# Patient Record
Sex: Female | Born: 1980 | Race: White | Hispanic: No | Marital: Married | Smoking: Former smoker
Health system: Southern US, Community
[De-identification: ages and names within clinical notes are randomized; demographics above are authoritative.]

## PROBLEM LIST (undated history)

## (undated) DIAGNOSIS — R112 Nausea with vomiting, unspecified: Secondary | ICD-10-CM

## (undated) DIAGNOSIS — F909 Attention-deficit hyperactivity disorder, unspecified type: Secondary | ICD-10-CM

## (undated) DIAGNOSIS — R51 Headache: Secondary | ICD-10-CM

## (undated) DIAGNOSIS — I7300 Raynaud's syndrome without gangrene: Secondary | ICD-10-CM

## (undated) DIAGNOSIS — M2550 Pain in unspecified joint: Secondary | ICD-10-CM

## (undated) DIAGNOSIS — G4700 Insomnia, unspecified: Secondary | ICD-10-CM

## (undated) DIAGNOSIS — N7011 Chronic salpingitis: Secondary | ICD-10-CM

## (undated) DIAGNOSIS — Z973 Presence of spectacles and contact lenses: Secondary | ICD-10-CM

## (undated) DIAGNOSIS — F419 Anxiety disorder, unspecified: Secondary | ICD-10-CM

## (undated) DIAGNOSIS — Z9079 Acquired absence of other genital organ(s): Secondary | ICD-10-CM

## (undated) DIAGNOSIS — K589 Irritable bowel syndrome without diarrhea: Secondary | ICD-10-CM

## (undated) DIAGNOSIS — T4145XA Adverse effect of unspecified anesthetic, initial encounter: Secondary | ICD-10-CM

## (undated) DIAGNOSIS — Z9889 Other specified postprocedural states: Secondary | ICD-10-CM

## (undated) DIAGNOSIS — T783XXA Angioneurotic edema, initial encounter: Secondary | ICD-10-CM

## (undated) HISTORY — DX: Raynaud's syndrome without gangrene: I73.00

## (undated) HISTORY — DX: Angioneurotic edema, initial encounter: T78.3XXA

## (undated) HISTORY — DX: Presence of spectacles and contact lenses: Z97.3

## (undated) HISTORY — DX: Pain in unspecified joint: M25.50

## (undated) HISTORY — DX: Acquired absence of other genital organ(s): Z90.79

## (undated) HISTORY — DX: Insomnia, unspecified: G47.00

## (undated) HISTORY — DX: Chronic salpingitis: N70.11

## (undated) HISTORY — PX: DIAGNOSTIC LAPAROSCOPY: SUR761

---
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
## 2002-03-07 ENCOUNTER — Ambulatory Visit (HOSPITAL_COMMUNITY): Admission: RE | Admit: 2002-03-07 | Discharge: 2002-03-07 | Payer: Self-pay | Admitting: Gastroenterology

## 2002-03-07 ENCOUNTER — Encounter: Payer: Self-pay | Admitting: Gastroenterology

## 2002-03-16 ENCOUNTER — Encounter: Payer: Self-pay | Admitting: Gastroenterology

## 2002-03-16 ENCOUNTER — Ambulatory Visit (HOSPITAL_COMMUNITY): Admission: RE | Admit: 2002-03-16 | Discharge: 2002-03-16 | Payer: Self-pay | Admitting: Gastroenterology

## 2004-02-02 ENCOUNTER — Other Ambulatory Visit: Admission: RE | Admit: 2004-02-02 | Discharge: 2004-02-02 | Payer: Self-pay | Admitting: Obstetrics & Gynecology

## 2004-04-21 HISTORY — PX: ESOPHAGOGASTRODUODENOSCOPY: SHX1529

## 2004-11-13 ENCOUNTER — Ambulatory Visit: Payer: Self-pay | Admitting: Gastroenterology

## 2005-04-17 ENCOUNTER — Other Ambulatory Visit: Admission: RE | Admit: 2005-04-17 | Discharge: 2005-04-17 | Payer: Self-pay | Admitting: Obstetrics & Gynecology

## 2005-04-18 ENCOUNTER — Other Ambulatory Visit: Admission: RE | Admit: 2005-04-18 | Discharge: 2005-04-18 | Payer: Self-pay | Admitting: Obstetrics & Gynecology

## 2009-06-08 ENCOUNTER — Ambulatory Visit: Payer: Self-pay | Admitting: Family Medicine

## 2010-04-21 DIAGNOSIS — N7011 Chronic salpingitis: Secondary | ICD-10-CM

## 2010-04-21 HISTORY — PX: BILATERAL SALPINGECTOMY: SHX5743

## 2010-04-21 HISTORY — DX: Chronic salpingitis: N70.11

## 2011-02-19 NOTE — Patient Instructions (Addendum)
   Your procedure is scheduled on: Tuesday November 13th  Enter through the [employer] of [facility] at: [employer] up the phone at the desk and dial [phone] and inform us of your arrival.  Please call this number if you have any problems the morning of surgery: [phone]  Remember: Do not eat food after midnight: Monday Do not drink clear liquids after:midnight Monday Take these medicines the morning of surgery with a SIP OF WATER:none  Do not wear jewelry, make-up, or FINGER nail polish Do not wear lotions, powders, or perfumes.  You may not  wear deodorant. Do not shave 48 hours prior to surgery. Do not bring valuables to the hospital.  Leave suitcase in the car. After Surgery it may be brought to your room. For patients being admitted to the hospital, checkout time is 11:00am the day of discharge.  Patients discharged on the day of surgery will not be allowed to drive home.     Remember to use your hibiclens as instructed.Please shower with 1/2 bottle the evening before your surgery and the other 1/2 bottle the morning of surgery.

## 2011-02-21 ENCOUNTER — Encounter (HOSPITAL_COMMUNITY)
Admission: RE | Admit: 2011-02-21 | Discharge: 2011-02-21 | Disposition: A | Discharge status: Home / Self Care | Payer: BC Managed Care – PPO | Source: Ambulatory Visit | Attending: Obstetrics & Gynecology | Admitting: Obstetrics & Gynecology

## 2011-02-21 ENCOUNTER — Encounter (HOSPITAL_COMMUNITY): Payer: Self-pay

## 2011-02-21 HISTORY — DX: Adverse effect of unspecified anesthetic, initial encounter: T41.45XA

## 2011-02-21 HISTORY — DX: Irritable bowel syndrome, unspecified: K58.9

## 2011-02-21 HISTORY — DX: Other specified postprocedural states: Z98.890

## 2011-02-21 HISTORY — DX: Nausea with vomiting, unspecified: R11.2

## 2011-02-21 HISTORY — DX: Headache: R51

## 2011-02-21 HISTORY — DX: Anxiety disorder, unspecified: F41.9

## 2011-02-21 HISTORY — DX: Attention-deficit hyperactivity disorder, unspecified type: F90.9

## 2011-02-21 LAB — CBC
HCT: 41.6 % (ref 36.0–46.0)
Hemoglobin: 14.2 g/dL (ref 12.0–15.0)
MCH: 30 pg (ref 26.0–34.0)
MCHC: 34.1 g/dL (ref 30.0–36.0)
MCV: 87.8 fL (ref 78.0–100.0)
Platelets: 280 10*3/uL (ref 150–400)
RBC: 4.74 MIL/uL (ref 3.87–5.11)
RDW: 12.3 % (ref 11.5–15.5)
WBC: 8.6 10*3/uL (ref 4.0–10.5)

## 2011-02-21 LAB — SURGICAL PCR SCREEN: MRSA, PCR: NEGATIVE

## 2011-02-25 ENCOUNTER — Other Ambulatory Visit: Payer: Self-pay | Admitting: Obstetrics & Gynecology

## 2011-03-04 ENCOUNTER — Ambulatory Visit (HOSPITAL_COMMUNITY): Payer: BC Managed Care – PPO | Admitting: Anesthesiology

## 2011-03-04 ENCOUNTER — Other Ambulatory Visit: Payer: Self-pay | Admitting: Obstetrics & Gynecology

## 2011-03-04 ENCOUNTER — Ambulatory Visit (HOSPITAL_COMMUNITY)
Admission: RE | Admit: 2011-03-04 | Discharge: 2011-03-04 | Disposition: A | Discharge status: Home / Self Care | Payer: BC Managed Care – PPO | Source: Ambulatory Visit | Attending: Obstetrics & Gynecology | Admitting: Obstetrics & Gynecology

## 2011-03-04 ENCOUNTER — Encounter (HOSPITAL_COMMUNITY): Payer: Self-pay | Admitting: Anesthesiology

## 2011-03-04 ENCOUNTER — Encounter (HOSPITAL_COMMUNITY): Payer: Self-pay | Admitting: *Deleted

## 2011-03-04 ENCOUNTER — Encounter (HOSPITAL_COMMUNITY)
Admission: RE | Disposition: A | Discharge status: Home / Self Care | Payer: Self-pay | Source: Ambulatory Visit | Attending: Obstetrics & Gynecology

## 2011-03-04 DIAGNOSIS — N7013 Chronic salpingitis and oophoritis: Secondary | ICD-10-CM | POA: Insufficient documentation

## 2011-03-04 DIAGNOSIS — Z01812 Encounter for preprocedural laboratory examination: Secondary | ICD-10-CM | POA: Insufficient documentation

## 2011-03-04 DIAGNOSIS — N979 Female infertility, unspecified: Secondary | ICD-10-CM | POA: Insufficient documentation

## 2011-03-04 DIAGNOSIS — N83209 Unspecified ovarian cyst, unspecified side: Secondary | ICD-10-CM | POA: Insufficient documentation

## 2011-03-04 DIAGNOSIS — Z01818 Encounter for other preprocedural examination: Secondary | ICD-10-CM | POA: Insufficient documentation

## 2011-03-04 DIAGNOSIS — N949 Unspecified condition associated with female genital organs and menstrual cycle: Secondary | ICD-10-CM | POA: Insufficient documentation

## 2011-03-04 DIAGNOSIS — N736 Female pelvic peritoneal adhesions (postinfective): Secondary | ICD-10-CM | POA: Insufficient documentation

## 2011-03-04 LAB — CBC
MCH: 29.6 pg (ref 26.0–34.0)
Platelets: 280 10*3/uL (ref 150–400)
RBC: 4.73 MIL/uL (ref 3.87–5.11)
RDW: 12.1 % (ref 11.5–15.5)
WBC: 7.8 10*3/uL (ref 4.0–10.5)

## 2011-03-04 LAB — TYPE AND SCREEN

## 2011-03-04 LAB — PREGNANCY, URINE: Preg Test, Ur: NEGATIVE

## 2011-03-04 SURGERY — ROBOTIC ASSISTED LAPAROSCOPIC LYSIS OF ADHESION
Anesthesia: General | Site: Abdomen | Wound class: Clean Contaminated

## 2011-03-04 MED ORDER — SCOPOLAMINE 1 MG/3DAYS TD PT72
MEDICATED_PATCH | TRANSDERMAL | Status: AC
  Filled 2011-03-04: qty 1

## 2011-03-04 MED ORDER — ONDANSETRON HCL 4 MG/2ML IJ SOLN
INTRAMUSCULAR | Status: AC
  Filled 2011-03-04: qty 2

## 2011-03-04 MED ORDER — SCOPOLAMINE 1 MG/3DAYS TD PT72
1.0000 | MEDICATED_PATCH | Freq: Once | TRANSDERMAL | Status: DC
  Administered 2011-03-04: 1.5 mg via TRANSDERMAL

## 2011-03-04 MED ORDER — PROPOFOL 10 MG/ML IV EMUL
INTRAVENOUS | Status: AC
  Filled 2011-03-04: qty 20

## 2011-03-04 MED ORDER — GLYCOPYRROLATE 0.2 MG/ML IJ SOLN
INTRAMUSCULAR | Status: AC
  Filled 2011-03-04: qty 1

## 2011-03-04 MED ORDER — MIDAZOLAM HCL 5 MG/5ML IJ SOLN
INTRAMUSCULAR | Status: DC | PRN
  Administered 2011-03-04: 2 mg via INTRAVENOUS

## 2011-03-04 MED ORDER — DEXAMETHASONE SODIUM PHOSPHATE 10 MG/ML IJ SOLN
INTRAMUSCULAR | Status: AC
  Filled 2011-03-04: qty 1

## 2011-03-04 MED ORDER — ONDANSETRON HCL 4 MG/2ML IJ SOLN
INTRAMUSCULAR | Status: DC | PRN
  Administered 2011-03-04: 4 mg via INTRAVENOUS

## 2011-03-04 MED ORDER — FENTANYL CITRATE 0.05 MG/ML IJ SOLN
INTRAMUSCULAR | Status: AC
  Administered 2011-03-04: 25 ug via INTRAVENOUS
  Filled 2011-03-04: qty 2

## 2011-03-04 MED ORDER — ROCURONIUM BROMIDE 50 MG/5ML IV SOLN
INTRAVENOUS | Status: AC
  Filled 2011-03-04: qty 1

## 2011-03-04 MED ORDER — LIDOCAINE HCL (CARDIAC) 20 MG/ML IV SOLN
INTRAVENOUS | Status: DC | PRN
  Administered 2011-03-04: 50 mg via INTRAVENOUS

## 2011-03-04 MED ORDER — ROCURONIUM BROMIDE 100 MG/10ML IV SOLN
INTRAVENOUS | Status: DC | PRN
  Administered 2011-03-04: 10 mg via INTRAVENOUS
  Administered 2011-03-04: 35 mg via INTRAVENOUS
  Administered 2011-03-04: 5 mg via INTRAVENOUS
  Administered 2011-03-04: 10 mg via INTRAVENOUS

## 2011-03-04 MED ORDER — FENTANYL CITRATE 0.05 MG/ML IJ SOLN
INTRAMUSCULAR | Status: AC
  Filled 2011-03-04: qty 5

## 2011-03-04 MED ORDER — KETOROLAC TROMETHAMINE 30 MG/ML IJ SOLN
INTRAMUSCULAR | Status: DC | PRN
  Administered 2011-03-04: 30 mg via INTRAVENOUS

## 2011-03-04 MED ORDER — FENTANYL CITRATE 0.05 MG/ML IJ SOLN
INTRAMUSCULAR | Status: DC | PRN
  Administered 2011-03-04 (×2): 100 ug via INTRAVENOUS
  Administered 2011-03-04: 150 ug via INTRAVENOUS

## 2011-03-04 MED ORDER — OXYCODONE-ACETAMINOPHEN 7.5-325 MG PO TABS: 1.0000 | ORAL_TABLET | ORAL | Status: AC | PRN

## 2011-03-04 MED ORDER — NEOSTIGMINE METHYLSULFATE 1 MG/ML IJ SOLN
INTRAMUSCULAR | Status: DC | PRN
  Administered 2011-03-04: 3 mg via INTRAVENOUS

## 2011-03-04 MED ORDER — KETOROLAC TROMETHAMINE 30 MG/ML IJ SOLN
INTRAMUSCULAR | Status: AC
  Filled 2011-03-04: qty 1

## 2011-03-04 MED ORDER — RINGERS IRRIGATION IR SOLN
Status: DC | PRN
  Administered 2011-03-04: 1

## 2011-03-04 MED ORDER — CEFAZOLIN SODIUM 1-5 GM-% IV SOLN
INTRAVENOUS | Status: AC
  Filled 2011-03-04: qty 50

## 2011-03-04 MED ORDER — CEFAZOLIN SODIUM 1-5 GM-% IV SOLN: 1.0000 g | INTRAVENOUS | Status: DC

## 2011-03-04 MED ORDER — NEOSTIGMINE METHYLSULFATE 1 MG/ML IJ SOLN
INTRAMUSCULAR | Status: AC
  Filled 2011-03-04: qty 10

## 2011-03-04 MED ORDER — GLYCOPYRROLATE 0.2 MG/ML IJ SOLN
INTRAMUSCULAR | Status: DC | PRN
  Administered 2011-03-04: .6 mg via INTRAVENOUS

## 2011-03-04 MED ORDER — BUPIVACAINE HCL (PF) 0.25 % IJ SOLN
INTRAMUSCULAR | Status: DC | PRN
  Administered 2011-03-04: 18 mL

## 2011-03-04 MED ORDER — PROPOFOL 10 MG/ML IV EMUL
INTRAVENOUS | Status: DC | PRN
  Administered 2011-03-04: 150 mg via INTRAVENOUS

## 2011-03-04 MED ORDER — LIDOCAINE HCL (CARDIAC) 20 MG/ML IV SOLN
INTRAVENOUS | Status: AC
  Filled 2011-03-04: qty 5

## 2011-03-04 MED ORDER — MEPERIDINE HCL 25 MG/ML IJ SOLN: 6.2500 mg | INTRAMUSCULAR | Status: DC | PRN

## 2011-03-04 MED ORDER — FENTANYL CITRATE 0.05 MG/ML IJ SOLN
25.0000 ug | INTRAMUSCULAR | Status: DC | PRN
  Administered 2011-03-04: 25 ug via INTRAVENOUS

## 2011-03-04 MED ORDER — MIDAZOLAM HCL 2 MG/2ML IJ SOLN
INTRAMUSCULAR | Status: AC
  Filled 2011-03-04: qty 2

## 2011-03-04 MED ORDER — FENTANYL CITRATE 0.05 MG/ML IJ SOLN
INTRAMUSCULAR | Status: AC
  Filled 2011-03-04: qty 2

## 2011-03-04 MED ORDER — LACTATED RINGERS IV SOLN
INTRAVENOUS | Status: DC
  Administered 2011-03-04: 1000 mL via INTRAVENOUS
  Administered 2011-03-04: 10:00:00 via INTRAVENOUS

## 2011-03-04 MED ORDER — KETOROLAC TROMETHAMINE 30 MG/ML IJ SOLN: 15.0000 mg | Freq: Once | INTRAMUSCULAR | Status: DC | PRN

## 2011-03-04 MED ORDER — CEFAZOLIN SODIUM 1-5 GM-% IV SOLN
INTRAVENOUS | Status: DC | PRN
  Administered 2011-03-04: 1 g via INTRAVENOUS

## 2011-03-04 MED ORDER — PROMETHAZINE HCL 25 MG/ML IJ SOLN: 6.2500 mg | INTRAMUSCULAR | Status: DC | PRN

## 2011-03-04 MED ORDER — DEXAMETHASONE SODIUM PHOSPHATE 4 MG/ML IJ SOLN
INTRAMUSCULAR | Status: DC | PRN
  Administered 2011-03-04: 10 mg via INTRAVENOUS

## 2011-03-04 SURGICAL SUPPLY — 37 items
ADH SKN CLS APL DERMABOND .7 (GAUZE/BANDAGES/DRESSINGS) ×2
CABLE HIGH FREQUENCY MONO STRZ (ELECTRODE) ×3 IMPLANT
CLOTH BEACON ORANGE TIMEOUT ST (SAFETY) ×3 IMPLANT
CONT PATH 16OZ SNAP LID 3702 (MISCELLANEOUS) ×3 IMPLANT
COVER MAYO STAND STRL (DRAPES) ×3 IMPLANT
COVER TABLE BACK 60X90 (DRAPES) ×6 IMPLANT
COVER TIP SHEARS 8 DVNC (MISCELLANEOUS) ×2 IMPLANT
COVER TIP SHEARS 8MM DA VINCI (MISCELLANEOUS) ×1
DERMABOND ADVANCED (GAUZE/BANDAGES/DRESSINGS) ×1
DERMABOND ADVANCED .7 DNX12 (GAUZE/BANDAGES/DRESSINGS) ×4 IMPLANT
DRAPE HUG U DISPOSABLE (DRAPE) ×3 IMPLANT
DRAPE LG THREE QUARTER DISP (DRAPES) ×6 IMPLANT
DRAPE MONITOR DA VINCI (DRAPE) ×3 IMPLANT
DRAPE WARM FLUID 44X44 (DRAPE) ×3 IMPLANT
ELECT REM PT RETURN 9FT ADLT (ELECTROSURGICAL) ×3
ELECTRODE REM PT RTRN 9FT ADLT (ELECTROSURGICAL) ×2 IMPLANT
EVACUATOR SMOKE 8.L (FILTER) ×3 IMPLANT
GLOVE BIO SURGEON STRL SZ 6.5 (GLOVE) ×6 IMPLANT
GOWN PREVENTION PLUS LG XLONG (DISPOSABLE) ×12 IMPLANT
KIT DISP ACCESSORY 4 ARM (KITS) ×3 IMPLANT
PACK LAVH (CUSTOM PROCEDURE TRAY) ×3 IMPLANT
PAD PREP 24X48 CUFFED NSTRL (MISCELLANEOUS) ×6 IMPLANT
POSITIONER SURGICAL ARM (MISCELLANEOUS) ×6 IMPLANT
SET IRRIG TUBING LAPAROSCOPIC (IRRIGATION / IRRIGATOR) ×5 IMPLANT
SOLUTION ELECTROLUBE (MISCELLANEOUS) ×3 IMPLANT
SUT VIC AB 4-0 PS2 27 (SUTURE) ×6 IMPLANT
SYR 50ML LL SCALE MARK (SYRINGE) ×3 IMPLANT
SYSTEM CONVERTIBLE TROCAR (TROCAR) IMPLANT
TOWEL OR 17X24 6PK STRL BLUE (TOWEL DISPOSABLE) ×9 IMPLANT
TRAY FOLEY BAG SILVER LF 14FR (CATHETERS) ×3 IMPLANT
TROCAR 12M 150ML BLUNT (TROCAR) IMPLANT
TROCAR DISP BLADELESS 8 DVNC (TROCAR) ×2 IMPLANT
TROCAR DISP BLADELESS 8MM (TROCAR) ×1
TROCAR XCEL NON-BLD 5MMX100MML (ENDOMECHANICALS) ×3 IMPLANT
TUBING FILTER THERMOFLATOR (ELECTROSURGICAL) ×6 IMPLANT
WARMER LAPAROSCOPE (MISCELLANEOUS) ×3 IMPLANT
WATER STERILE IRR 1000ML POUR (IV SOLUTION) ×9 IMPLANT

## 2011-03-04 NOTE — H&P (Signed)
Cheyenne [name] is an 30 y.o. female G0  RP:  Bilateral hydrosalpinges with pelvic pain, adhesions and infertility.  Pertinent Gynecological History: Menses: flow is moderate Contraception: none Blood transfusions: none Sexually transmitted diseases: no past history Previous GYN Procedures: LPS previous tuboplasty  Last mammogram: none Last pap: normal OB History: G0 infertility   Menstrual History:  No LMP recorded.    Past Medical History  Diagnosis Date  . Headache   . Anxiety   . ADD (attention deficit disorder with hyperactivity)   . ADHD (attention deficit hyperactivity disorder)   . IBS (irritable bowel syndrome)     lactose intol  . Complication of anesthesia 93    93hallucinations with anesthesia, 2001-slow to awaken  . PONV (postoperative nausea and vomiting)     Past Surgical History  Procedure Date  . Appendectomy 93  . Incise and drain abcess 93    hallucinations with anesthesia  . Diagnostic laparoscopy 2001  . Cholecystectomy 2003    ponv    History reviewed. No pertinent family history.  Social History:  reports that she quit smoking about 3 years ago. Her smoking use included Cigarettes. She quit after 10 years of use. She does not have any smokeless tobacco history on file. She reports that she drinks alcohol. She reports that she does not use illicit drugs.  Allergies: No Known Allergies  Prescriptions prior to admission  Medication Sig Dispense Refill  . amphetamine-dextroamphetamine (ADDERALL XR) 20 MG 24 hr capsule Take 20 mg by mouth 2 (two) times daily.        . benzonatate (TESSALON) 100 MG capsule Take 100 mg by mouth 3 (three) times daily as needed. For cough       . DiphenhydrAMINE HCl, Sleep, (ZZZQUIL) 25 MG CAPS Take 1 capsule by mouth at bedtime as needed. for sleep       . Levonorg-Eth Estrad Triphasic (ENPRESSE-28 PO) Take 1 tablet by mouth daily.        . prenatal vitamin w/FE, FA (PRENATAL 1 + 1) 27-1 MG TABS Take 1 tablet by  mouth daily.        . Pseudoeph-CPM-DM-APAP (COUGH COLD/FLU RELIEF M) 15-1-7.5-162.5 MG/5ML LIQD Take 5 mLs by mouth daily.          Blood pressure 103/69, pulse 88, temperature 98.1 F (36.7 C), temperature source Oral, resp. rate 16, SpO2 100.00%.  Results for orders placed during the hospital encounter of 03/04/11 (from the past 24 hour(s))  PREGNANCY, URINE     Status: Normal   Collection Time   03/04/11  6:11 AM      Component Value Range   Preg Test, Ur NEGATIVE    CBC     Status: Normal   Collection Time   03/04/11  6:35 AM      Component Value Range   WBC 7.8  4.0 - 10.5 (K/uL)   RBC 4.73  3.87 - 5.11 (MIL/uL)   Hemoglobin 14.0  12.0 - 15.0 (g/dL)   HCT 66.4  40.3 - 47.4 (%)   MCV 88.2  78.0 - 100.0 (fL)   MCH 29.6  26.0 - 34.0 (pg)   MCHC 33.6  30.0 - 36.0 (g/dL)   RDW 25.9  56.3 - 87.5 (%)   Platelets 280  150 - 400 (K/uL)    No results found.  Assessment/Plan: Bilataral hydrosalpinges with pelvic pain, adhesions and infertility for bilateral salpingectomies, lysis of adhesions assisted by [employer].  Surgery and risks reviewed.  Antrice Pal,MARIE-LYNE 03/04/2011, 7:22 AM

## 2011-03-04 NOTE — Anesthesia Preprocedure Evaluation (Signed)
Anesthesia Evaluation  Patient identified by MRN, date of birth, ID band Patient awake    Reviewed: Allergy & Precautions, H&P , NPO status , Patient's Chart, lab work & pertinent test results  Airway Mallampati: I TM Distance: >3 FB Neck ROM: full    Dental  (+) Teeth Intact   Pulmonary neg pulmonary ROS,    Pulmonary exam normal       Cardiovascular neg cardio ROS     Neuro/Psych    GI/Hepatic negative GI ROS, Neg liver ROS,   Endo/Other  Negative Endocrine ROS  Renal/GU negative Renal ROS  Genitourinary negative   Musculoskeletal negative musculoskeletal ROS (+)   Abdominal Normal abdominal exam  (+)   Peds negative pediatric ROS (+)  Hematology negative hematology ROS (+)   Anesthesia Other Findings   Reproductive/Obstetrics negative OB ROS                           Anesthesia Physical Anesthesia Plan  ASA: II  Anesthesia Plan: General   Post-op Pain Management:    Induction: Intravenous  Airway Management Planned: Oral ETT  Additional Equipment:   Intra-op Plan:   Post-operative Plan: Extubation in OR  Informed Consent: I have reviewed the patients History and Physical, chart, labs and discussed the procedure including the risks, benefits and alternatives for the proposed anesthesia with the patient or authorized representative who has indicated his/her understanding and acceptance.   Dental Advisory Given  Plan Discussed with: CRNA  Anesthesia Plan Comments:         Anesthesia Quick Evaluation

## 2011-03-04 NOTE — Anesthesia Procedure Notes (Signed)
Procedure Name: Intubation Date/Time: 03/04/2011 7:30 AM Performed by: Isabella Bowens Pre-anesthesia Checklist: Patient identified, Emergency Drugs available, Timeout performed, Suction available and Patient being monitored Patient Re-evaluated:Patient Re-evaluated prior to inductionOxygen Delivery Method: Circle System Utilized Preoxygenation: Pre-oxygenation with 100% oxygen Intubation Type: IV induction Ventilation: Mask ventilation without difficulty Laryngoscope Size: Mac and 3 Tube type: Oral Tube size: 7.0 mm Number of attempts: 1 Airway Equipment and Method: stylet Placement Confirmation: ETT inserted through vocal cords under direct vision,  breath sounds checked- equal and bilateral and positive ETCO2 Secured at: 21 cm Tube secured with: Tape Dental Injury: Teeth and Oropharynx as per pre-operative assessment

## 2011-03-04 NOTE — Transfer of Care (Signed)
Immediate Anesthesia Transfer of Care Note  Patient: Cheyenne [name]  Procedure(s) Performed:  ROBOTIC ASSISTED LAPAROSCOPIC LYSIS OF ADHESION - Extensive Lysis of Adhesions with Left Ovarian Cystectomy; BILATERAL SALPINGECTOMY - Bilateral Salpingectomies  Patient Location: PACU  Anesthesia Type: General  Level of Consciousness: oriented and sedated  Airway & Oxygen Therapy: Patient Spontanous Breathing and Patient connected to nasal cannula oxygen  Post-op Assessment: Report given to PACU RN and Post -op Vital signs reviewed and stable  Post vital signs: Reviewed and stable  Complications: No apparent anesthesia complications

## 2011-03-04 NOTE — Progress Notes (Signed)
Pt with 2 attempts to void in bathroom, unsuccessful, instructed pt to drink plenty of fluids and call in unable to void in 5 hours.

## 2011-03-04 NOTE — Op Note (Signed)
03/04/2011  10:24 AM  PATIENT:  Cheyenne [name]  30 y.o. female  PRE-OPERATIVE DIAGNOSIS:  Bilateral Hydrosalpinges; Pelvic Adhesions; Pelvic Pain; Infertility  POST-OPERATIVE DIAGNOSIS:  Bilateral Hydrosalpinges; Left simple ovarian cyst; Pelvic Adhesions; Pelvic Pain; Infertility  PROCEDURE:  Procedure(s): ROBOTIC ASSISTED LAPAROSCOPIC EXTENSIVE LYSIS OF ADHESION BILATERAL SALPINGECTOMY AND LEFT OVARIAN CYSTECTOMY  SURGEON:  Surgeon(s): Marie-Lyne Ianmichael Amescua Sheronette Cathie Beams, MD  ASSISTANTS: Serita Kyle, MD  ANESTHESIA:   general  PROCEDURE:  Under general anesthesia the patient is in lithotomy position. She is prepped with ChloraPrep on the abdomen and with Betadine on the vulvar and vaginal areas. The patient is draped as usual. The Foley is put in place in the bladder. The vaginal exam reveals an anteverted uterus mobile, soft adnexal masses are present.  The weighted speculum is put in the vagina, the anterior lip of the cervix is grasped with a tenaculum and the acorn uterine cannula is put in place. At the level of the abdomen, the supraumbilical area is infiltrated with Marcaine one quarter plain. A 1.5 cm incision is done with the scalpel. The aponeurosis is opened with Mayo scissors under direct vision. The parietal peritoneum is opened bluntly with the finger. A pursestring stitch of Vicryl 0 is done on the aponeurosis. The Roseanne Reno is inserted at that level under direct vision. A pneumoperitoneum is created with CO2.  The camera is am inserted at that level. No adhesion was present with the anterior abdominal wall were trochars will be inserted. A semicircular configuration for trocar placement is used. 2 robotic ports are inserted under direct vision on the right side and one robotic port on the lower left. The the 5 mm assistant port is inserted on the left superior side.the robot his docked from the right side. The instruments are inserted, the prograsp in the third arm,  the Endo Shears scissor and the right arm, and the PK in the left arm.  We then go to the console. The surgery is recorded on a DVD and pictures are taken before and after the procedure.  On inspection of the pelvis we note large bilateral hydrosalpinges. The left ovary has a calm small simple cyst, about 2-3 cm in diameter. Adhesions are present at the level of the sigmoid with the the left pelvic and abdominal walls.  Adhesions also present on between on the left ovary and the ovarian fossa.  Adhesions are present on the right between the tube and the ovary with of the ovarian fossa and pelvic wall. The small bowel is adherent on the right with the pelvic wall.  Finally adhesions are present between the bladder and the uterus anteriorly. All the adhesions described were lysed. We used the PK and the Endo Shears scissor to complete the right first and then left salpingectomies. We started at the proximal aspect of the tubes on each side and followed the lower aspect of the tubes until both tubes were completely freed.  Traction and countertraction was used with the PK and the Prograsp to complete the left ovarian cystectomy.  The cystic fluid was clear and the wall was very thin. The ovarian cyst wall specimen was sent to pathology.  The PK was used to complete hemostasis at the ovarian cyst bed.  At the end of the procedure the anatomy was completely restored.  Hemostasis was adequate at all levels.  The ureters were in normal anatomic position. The patient is post appendectomy. The liver was normal to inspection.  The robotic instruments  were removed under direct vision. We then undocked the robot. We proceeded by laparoscopy with a 5 mm camera. An Endobag was used to remove the the specimens, the right and left tubes. We then irrigated and suctioned the abdominopelvic cavities. Interceed was on applied in the pelvis at the sites of lysis of adhesions. Hemostasis was adequate at all levels. All instruments were  removed the CO2 was on evacuated. The pursestring stitch was attached at the supraumbilical incision. Hemostasis was completed with the Bovie as needed. All incisions were closed with a Vicryl 4-0 subcuticular stitch. Dermabond was added on all incisions. The acorn and tenaculum were removed from the vagina.  ESTIMATED BLOOD LOSS: 70 cc   Intake/Output Summary (Last 24 hours) at 03/04/11 1024 Last data filed at 03/04/11 1000  Gross per 24 hour  Intake   1000 ml  Output    170 ml  Net    830 ml     BLOOD ADMINISTERED:none   LOCAL MEDICATIONS USED:  MARCAINE 20 CC  SPECIMEN:  Source of Specimen:  Bilateral tubes and left ovarian cyst wall  DISPOSITION OF SPECIMEN:  PATHOLOGY  COUNTS:  YES  PLAN OF CARE: Transfer to PACU   Genia Del MD

## 2011-03-04 NOTE — Anesthesia Postprocedure Evaluation (Signed)
  Anesthesia Post-op Note  Patient: Cheyenne [name]  Procedure(s) Performed:  ROBOTIC ASSISTED LAPAROSCOPIC LYSIS OF ADHESION - Extensive Lysis of Adhesions with Left Ovarian Cystectomy; BILATERAL SALPINGECTOMY - Bilateral Salpingectomies  Patient is awake and responsive. Pain and nausea are reasonably well controlled. Vital signs are stable and clinically acceptable. Oxygen saturation is clinically acceptable. There are no apparent anesthetic complications at this time. Patient is ready for discharge.

## 2011-03-04 NOTE — Discharge Summary (Signed)
  Physician Discharge Summary  Patient ID: Cheyenne [name] MRN: [id number] DOB/AGE: 1980/09/15 30 y.o.  Admit date: 03/04/2011 Discharge date: 03/04/2011  Admission Diagnoses: Bilateral Hydrosalpinx, Adhesions, Pelvic Pain, Infertility  Discharge Diagnoses: Bilateral Hydrosalpinx, Adhesions, Pelvic Pain, Infertility        Active Problems:  * No active hospital problems. *    Discharged Condition: good  Hospital Course: Outpatient  Consults: none  Treatments: surgery  Disposition: Final discharge disposition not confirmed   Current Discharge Medication List    START taking these medications   Details  oxyCODONE-acetaminophen (PERCOCET) 7.5-325 MG per tablet Take 1 tablet by mouth every 4 (four) hours as needed for pain. Qty: 30 tablet, Refills: 0      CONTINUE these medications which have NOT CHANGED   Details  amphetamine-dextroamphetamine (ADDERALL XR) 20 MG 24 hr capsule Take 20 mg by mouth 2 (two) times daily.      benzonatate (TESSALON) 100 MG capsule Take 100 mg by mouth 3 (three) times daily as needed. For cough     DiphenhydrAMINE HCl, Sleep, (ZZZQUIL) 25 MG CAPS Take 1 capsule by mouth at bedtime as needed. for sleep     Levonorg-Eth Estrad Triphasic (ENPRESSE-28 PO) Take 1 tablet by mouth daily.      prenatal vitamin w/FE, FA (PRENATAL 1 + 1) 27-1 MG TABS Take 1 tablet by mouth daily.      Pseudoeph-CPM-DM-APAP (COUGH COLD/FLU RELIEF M) 15-1-7.5-162.5 MG/5ML LIQD Take 5 mLs by mouth daily.         Follow-up Information    Follow up with Stace Peace,MARIE-LYNE in 3 weeks.   Contact information:   [address] [place] [address] [phone]          Signed: Genia Del 03/04/2011, 10:58 AM

## 2011-08-19 ENCOUNTER — Encounter: Payer: Self-pay | Admitting: Medical

## 2011-08-19 ENCOUNTER — Ambulatory Visit (INDEPENDENT_AMBULATORY_CARE_PROVIDER_SITE_OTHER): Payer: BC Managed Care – PPO | Admitting: Medical

## 2011-08-19 VITALS — BP 100/68 | HR 92 | Temp 98.1°F | Resp 16 | Wt 132.0 lb

## 2011-08-19 DIAGNOSIS — F988 Other specified behavioral and emotional disorders with onset usually occurring in childhood and adolescence: Secondary | ICD-10-CM

## 2011-08-19 DIAGNOSIS — L539 Erythematous condition, unspecified: Secondary | ICD-10-CM

## 2011-08-19 DIAGNOSIS — M2550 Pain in unspecified joint: Secondary | ICD-10-CM

## 2011-08-19 LAB — COMPREHENSIVE METABOLIC PANEL
ALT: 20 U/L (ref 0–35)
BUN: 9 mg/dL (ref 6–23)
CO2: 27 mEq/L (ref 19–32)
Calcium: 9 mg/dL (ref 8.4–10.5)
Chloride: 103 mEq/L (ref 96–112)
Creat: 0.63 mg/dL (ref 0.50–1.10)

## 2011-08-19 MED ORDER — AMPHETAMINE-DEXTROAMPHET ER 20 MG PO CP24: 20.0000 mg | ORAL_CAPSULE | Freq: Two times a day (BID) | ORAL | Status: DC

## 2011-08-19 NOTE — Progress Notes (Signed)
Subjective: Here as a new patient today.  She has not had a primary care provider. She has formerly seen Dr. Wynonia Lawman for psychiatry, and has been followed by gynecology as well as reproductive endocrinology for gynecological problems.   She is here to establish care and to discuss multiple concerns.  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  Has been on Adderall XR 20mg  BID.  Recently she is finding it more difficult to get in to see Dr. Wynonia Lawman and to get refills.  Apparently Dr. Wynonia Lawman is only in the office a few times per months, and the office staff has made it increasingly difficult to get refills. She has done well on Adderall.  She started noticing problems in undergrad and certainly had problems in law school with focus and figiting, not able to sit still for long.  However, in school classes were 50 minutes and this allowed frequent breaks.  But since becoming an attorney, it was very hard to sit still and focus while doing her job.  The adderall has allowed her to focus, to sit still, to not switch back and forth between tasks.  She needs refill as she is out of medication, and would like to come here for ADHD management.  She has a second c/o skin redness.   She notes for the last 81mo having blood red skin coloration of hands and fingertips,gets painful and has skin cracking in the winter, but this doesn't involve fingertips.  No cracking or sores of fingertips.  Her husband says her feet are abnormally hot.  She gets redness there as well.  She denies any prior skin disorder, no hx/o eczema.  Her head and ears will at times get red and tingly too.    She notes hx/o pains in both knees, fingers, hips, and if squatting down for 2-3 minutes has to have help getting up.  She notes morning stiffness for 10-15 minutes, and at times gets swelling of fingers like "sausage fingers", but usually with increased salt intake.  She  notes hx/o hip bursitis in 2007.    She has a complicated gynecological history starting with complications from appendectomy, scar tissue, and ultimately had both fallopian tubes removed this past winter, having issues with infertility.  Sees a reproductive endocrinologist.   No other c/o.  The following portions of the patient's history were reviewed and updated as appropriate: allergies, current medications, past family history, past medical history, past social history, past surgical history and problem list.  Past Medical History  Diagnosis Date  . Headache   . Anxiety   . ADHD (attention deficit hyperactivity disorder)   . IBS (irritable bowel syndrome)     lactose intol  . Complication of anesthesia 93    93hallucinations with anesthesia, 2001-slow to awaken  . PONV (postoperative nausea and vomiting)   . Wears glasses   . Polyarthralgia     knees, hips, fingers bilat  . Female infertility   . H/O bilateral salpingectomy     ovaries and uterus still present    No Known Allergies   Review of Systems ROS reviewed and was negative other than noted in HPI or above.    Objective:   Physical Exam  General appearance: alert, no distress, WD/WN, lean white female HEENT: normocephalic, sclerae anicteric, TMs pearly, nares patent, no discharge or erythema, pharynx normal Oral cavity: MMM, no lesions Neck: supple, no lymphadenopathy, no thyromegaly,  no masses Heart: RRR, normal S1, S2, no murmurs Lungs: CTA bilaterally, no wheezes, rhonchi, or rales Pulses: 2+ symmetric, upper and lower extremities, normal cap refill MSK: nontender joints, no obvious deformity or swelling, no arthritic changes of joints Neuro: non focal Skin: bilat hands from mid dorsum of bilat hands to fingers throughout with erythema, some flaking and slight scaling of skin, but no cracks, no whitening of distal fingers or cyanosis  Assessment and Plan :     Encounter Diagnoses  Name Primary?  . ADD  (attention deficit disorder) Yes  . Polyarthralgia   . Redness of skin    ADD - reviewed adult ADD questionnaire, and she marks severe symptoms in "no follow through, easily distractible, talks excessively,squirms, and can't stay seated."  We will get records from Dr. Wynonia Lawman as she signed release.   Script to c/t Adderall XR 20mg  BID.  discussed ways to deal with ADD.  recheck 57mo.  Given her exam and history regarding symmetrical polyarthralgia, joint stiffness, and redness of skin, I questions underlying rheumatological issue such as dermatomyositis or psoriatic arthritis.  Baseline labs today, and pending labs, likely referral to rheumatology.  Recheck pending labs and 57mo.

## 2011-08-20 ENCOUNTER — Telehealth: Payer: Self-pay | Admitting: Family Medicine

## 2011-08-20 LAB — CBC WITH DIFFERENTIAL/PLATELET
Eosinophils Relative: 3 % (ref 0–5)
HCT: 41.3 % (ref 36.0–46.0)
Hemoglobin: 13.8 g/dL (ref 12.0–15.0)
Lymphocytes Relative: 38 % (ref 12–46)
Lymphs Abs: 2.3 10*3/uL (ref 0.7–4.0)
MCV: 87.9 fL (ref 78.0–100.0)
Monocytes Absolute: 0.4 10*3/uL (ref 0.1–1.0)
Monocytes Relative: 7 % (ref 3–12)
RBC: 4.7 MIL/uL (ref 3.87–5.11)
WBC: 6 10*3/uL (ref 4.0–10.5)

## 2011-08-20 LAB — SEDIMENTATION RATE: Sed Rate: 1 mm/hr (ref 0–22)

## 2011-08-20 LAB — ANTI-NUCLEAR AB-TITER (ANA TITER): ANA Titer 1: 1:320 {titer} — ABNORMAL HIGH

## 2011-08-20 NOTE — Telephone Encounter (Signed)
Message copied by Janeice Robinson on Wed Aug 20, 2011  2:23 PM ------      Message from: Aleen Campi, DAVID S      Created: Wed Aug 20, 2011 12:14 PM       The ANA marker for inflammation was + but all other labs normal.              If she is agreeable, lets refer to Dr. Kellie Simmering or Dr. Juanetta Gosling rheumatology to eval for polyarthralgia, redness of hands, and possible underlying rheumatologic issue.

## 2011-08-20 NOTE — Telephone Encounter (Signed)
LMOM TO CB. CLS 

## 2011-08-21 ENCOUNTER — Telehealth: Payer: Self-pay

## 2011-08-21 NOTE — Telephone Encounter (Signed)
Faxed ov,labs,demo,and ins to Dr.Truslow

## 2011-08-21 NOTE — Telephone Encounter (Signed)
Pt called back and has been informed of labs and is agreeable to be reffered to Dr.Truslow or Dr.Hawkins

## 2011-09-17 ENCOUNTER — Ambulatory Visit (INDEPENDENT_AMBULATORY_CARE_PROVIDER_SITE_OTHER): Payer: BC Managed Care – PPO | Admitting: Medical

## 2011-09-17 ENCOUNTER — Encounter: Payer: Self-pay | Admitting: Medical

## 2011-09-17 VITALS — BP 98/60 | HR 80 | Temp 97.7°F | Resp 16 | Wt 134.0 lb

## 2011-09-17 DIAGNOSIS — I7300 Raynaud's syndrome without gangrene: Secondary | ICD-10-CM

## 2011-09-17 DIAGNOSIS — F909 Attention-deficit hyperactivity disorder, unspecified type: Secondary | ICD-10-CM

## 2011-09-17 DIAGNOSIS — G4700 Insomnia, unspecified: Secondary | ICD-10-CM

## 2011-09-17 MED ORDER — GUANFACINE HCL ER 2 MG PO TB24: 2.0000 mg | ORAL_TABLET | Freq: Every day | ORAL | Status: DC

## 2011-09-17 MED ORDER — AMPHETAMINE-DEXTROAMPHET ER 20 MG PO CP24: ORAL_CAPSULE | ORAL | Status: DC

## 2011-09-17 MED ORDER — AMPHETAMINE-DEXTROAMPHET ER 20 MG PO CP24: 20.0000 mg | ORAL_CAPSULE | Freq: Two times a day (BID) | ORAL | Status: DC

## 2011-09-17 NOTE — Progress Notes (Signed)
Subjective: Here for 70mo f/u.  I saw her a month ago as a new patient for 2 issues.  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  She had issues getting in with the office for appointments and refills, and decided to transfer care here.  She is compliant with her Adderall XR 20mg  BID.  This makes a tremendous improvement in her ability to focus and to help with fidgiting, not able to sit still for long.  The district judge has even commented on her fidgiting in the past, and this has prompted her to get this under control.  The medication really helps to calm her down.  I sent her to rheumatology (Dr. Kellie Simmering) after last visit for 49mo having blood red skin coloration of hands and fingertips.  This fingers get painful and has skin cracking in the winter, but this doesn't involve fingertips.  No cracking or sores of fingertips.  Her husband says her feet are abnormally hot.  She gets redness there as well.  She denies any prior skin disorder, no hx/o eczema.  Her head and ears will at times get red and tingly too.  She notes hx/o pains in both knees, fingers, hips, and if squatting down for 2-3 minutes has to have help getting up.  She notes morning stiffness for 10-15 minutes, and at times gets swelling of fingers like "sausage fingers", but usually with increased salt intake.  She notes hx/o hip bursitis in 2007.  Dr. Kellie Simmering diagnosed Raynaud's and made medication recommendations that were are going to consider today.  She has insomnia, but usually related worse stress.  Has used been prescribed Trazodone in the past, but has never used this.  No other c/o.  The following portions of the patient's history were reviewed and updated as appropriate: allergies, current medications, past family history, past medical history, past social history, past surgical history and problem list.  Past Medical History  Diagnosis Date  .  Headache   . Anxiety   . ADHD (attention deficit hyperactivity disorder)   . IBS (irritable bowel syndrome)     lactose intol  . Complication of anesthesia 93    93hallucinations with anesthesia, 2001-slow to awaken  . PONV (postoperative nausea and vomiting)   . Wears glasses   . Polyarthralgia     knees, hips, fingers bilat  . Female infertility   . H/O bilateral salpingectomy     ovaries and uterus still present  . Insomnia   . Raynaud phenomenon     Dr. Kellie Simmering, rheumatology consult 08/2011; +ANA    No Known Allergies   Review of Systems ROS reviewed and was negative other than noted in HPI or above.    Objective:   Physical Exam  General appearance: alert, no distress, WD/WN, lean white female   Assessment and Plan :     Encounter Diagnoses  Name Primary?  . ADHD (attention deficit hyperactivity disorder) Yes  . Raynaud phenomenon   . Insomnia    ADHD - does well on Adderrall.  However, after reviewed rheumatology consult notes, the suggestion was made to back her off stimulants as they may be related to Raynauds symptoms.  She is agreeable to trying other medications.  We will have her use a trial of Intuniv 1mg  daily x 1wk, then 2mg  daily x 1wk, then call report.  Meanwhile she will back down to Adderrall XR 20mg  once daily for  1wk, then 1 tablet QOD x 1 week.    Raynauds - reviewed rheumatology notes.  We will make changes as above in her ADHD medications.  We will see if the addition of Intuniv and the reduction of Adderrall helps with both the Raynaud's symptoms as well as helping her ADHD symptoms.   Insomnia - controlled with stress relieving measures, without need for medication.    Call report 2wk.

## 2011-09-17 NOTE — Patient Instructions (Signed)
Begin Intuniv starter packet.  During week one, continue to take Adderall ONCE daily.  In week 2 of the Intuniv trial, cut back on Adderall to every other day with the goal to stop Adderall in 1-2 weeks.  This all will depend on your response to Intuniv.    Call me back in 2 weeks.

## 2011-09-22 ENCOUNTER — Telehealth: Payer: Self-pay | Admitting: Medical

## 2011-09-22 NOTE — Telephone Encounter (Signed)
FYI- P.A. APPROVED

## 2011-10-10 ENCOUNTER — Telehealth: Payer: Self-pay | Admitting: Internal Medicine

## 2011-10-10 ENCOUNTER — Other Ambulatory Visit: Payer: Self-pay | Admitting: Medical

## 2011-10-10 MED ORDER — GUANFACINE HCL ER 2 MG PO TB24: 2.0000 mg | ORAL_TABLET | Freq: Every day | ORAL | Status: DC

## 2011-10-10 NOTE — Telephone Encounter (Signed)
done

## 2011-10-29 ENCOUNTER — Telehealth: Payer: Self-pay | Admitting: Internal Medicine

## 2011-10-29 ENCOUNTER — Other Ambulatory Visit: Payer: Self-pay | Admitting: Medical

## 2011-10-29 MED ORDER — AMPHETAMINE-DEXTROAMPHET ER 20 MG PO CP24: ORAL_CAPSULE | ORAL | Status: DC

## 2011-10-29 NOTE — Telephone Encounter (Signed)
Pt is given 3 month supply

## 2011-10-29 NOTE — Telephone Encounter (Signed)
rx ready 

## 2011-10-29 NOTE — Telephone Encounter (Signed)
Left message on pt work phone that rx was ready

## 2011-10-30 ENCOUNTER — Telehealth: Payer: Self-pay | Admitting: Medical

## 2011-10-30 NOTE — Telephone Encounter (Signed)
Pt stopped by to pick up rx and stated that when she went to pick up intuniv it was a 40 dollar copay. She states that it is too much.  She wants to know if there is an alternative that is less expensive. Please call pt.

## 2011-11-03 ENCOUNTER — Other Ambulatory Visit: Payer: Self-pay | Admitting: Medical

## 2011-11-03 MED ORDER — ATOMOXETINE HCL 40 MG PO CAPS: 40.0000 mg | ORAL_CAPSULE | Freq: Every day | ORAL | Status: DC

## 2011-11-03 NOTE — Telephone Encounter (Signed)
i sent stratterra script for once daily ([facility]).  Call and check copay.

## 2011-11-03 NOTE — Telephone Encounter (Signed)
I called and spoke with the patient and let her know that Kristian Covey would like to see her first to evaluate her because he has not seen her for this before. Patient agreed and I scheduled a her an appointment for 11/25/10.  CLS

## 2011-11-03 NOTE — Telephone Encounter (Signed)
pls call her pharmacy and ask how much copay would be for Stratterra 25mg  once daily #30?

## 2011-11-03 NOTE — Telephone Encounter (Signed)
Cheyenne [name] I called the patients pharmacy and the lady told me that she could only give me the cash price. The only way she could give the insurance price is if the patient came in to get the RX filled. CLS

## 2012-02-06 ENCOUNTER — Institutional Professional Consult (permissible substitution): Payer: BC Managed Care – PPO | Admitting: Medical

## 2012-02-06 ENCOUNTER — Encounter: Payer: Self-pay | Admitting: Medical

## 2012-02-06 ENCOUNTER — Ambulatory Visit (INDEPENDENT_AMBULATORY_CARE_PROVIDER_SITE_OTHER): Payer: BC Managed Care – PPO | Admitting: Medical

## 2012-02-06 VITALS — BP 100/70 | HR 76 | Temp 98.3°F | Resp 16 | Wt 145.0 lb

## 2012-02-06 DIAGNOSIS — N949 Unspecified condition associated with female genital organs and menstrual cycle: Secondary | ICD-10-CM

## 2012-02-06 DIAGNOSIS — F909 Attention-deficit hyperactivity disorder, unspecified type: Secondary | ICD-10-CM

## 2012-02-06 DIAGNOSIS — Z23 Encounter for immunization: Secondary | ICD-10-CM

## 2012-02-06 MED ORDER — AMPHETAMINE-DEXTROAMPHETAMINE 10 MG PO TABS: 10.0000 mg | ORAL_TABLET | Freq: Two times a day (BID) | ORAL | Status: DC

## 2012-02-06 NOTE — Patient Instructions (Signed)
Begin Adderall 10mg  regular release.    Either take once or twice daily the first few days, then just use once daily for up to a week.  After 1 week, cut these in half and just use 1/2 tablet or 5 mg daily for the next.   And then stop.

## 2012-02-06 NOTE — Progress Notes (Signed)
Subjective: Here to discuss ADHD medication.  Is in the process of getting In vitro fertilization and needs to come off her medication for ADHD.   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  She will be beginning medication in 3 wk for eventual IVF procedure.   Needs to stop medications and has questions about weaning off and what to do to cope with ADHD.    Prior to coming here she had been seeing Dr. Dorthula Perfect at [facility].    Objective: Wd, wn,nad Psych: pleasant, good eye contact, answers questions appropriately  Assessment: Encounter Diagnoses  Name Primary?  . ADHD (attention deficit hyperactivity disorder) Yes  . Need for influenza vaccination   . 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   Use once to twice daily for few days, then once daily for 1 wk, then 1/2 tablet daily for one week.  Then stop.  Consult with psychologist for help with non medication ways to cope.   Work on Clinical research associate and other strategies as we discussed today.  Influenza vaccine, VIS and counseling given.   F/u with IVF treatment as they are trying to conceive; thus the reason for discontinuing ADHD therapy at this point.

## 2012-04-16 ENCOUNTER — Other Ambulatory Visit: Payer: Self-pay | Admitting: Medical

## 2012-04-16 ENCOUNTER — Telehealth: Payer: Self-pay | Admitting: Medical

## 2012-04-16 MED ORDER — AMPHETAMINE-DEXTROAMPHET ER 20 MG PO CP24: ORAL_CAPSULE | ORAL | Status: DC

## 2012-04-16 NOTE — Telephone Encounter (Signed)
I received refill request on ADHD medication.  Wanted to verify that she is now planning to restart this correct?

## 2012-04-16 NOTE — Telephone Encounter (Signed)
Needs refill on Adderal, please call when ready

## 2012-04-16 NOTE — Telephone Encounter (Signed)
Patient states that she does want to restart the Adderall. She said she stop the medication to try and have a baby but she a miscarriage so she wants to start back on the mecication for now. She will come by the office today, if we leave early leave medication on the back door. CLS

## 2012-10-12 ENCOUNTER — Encounter: Payer: Self-pay | Admitting: Medical

## 2012-10-12 ENCOUNTER — Ambulatory Visit (INDEPENDENT_AMBULATORY_CARE_PROVIDER_SITE_OTHER): Payer: BC Managed Care – PPO | Admitting: Medical

## 2012-10-12 VITALS — BP 100/78 | HR 72 | Temp 98.1°F | Resp 16 | Wt 149.0 lb

## 2012-10-12 DIAGNOSIS — F909 Attention-deficit hyperactivity disorder, unspecified type: Secondary | ICD-10-CM

## 2012-10-12 DIAGNOSIS — I7300 Raynaud's syndrome without gangrene: Secondary | ICD-10-CM

## 2012-10-12 MED ORDER — AMPHETAMINE-DEXTROAMPHETAMINE 15 MG PO TABS: 15.0000 mg | ORAL_TABLET | Freq: Two times a day (BID) | ORAL | Status: DC

## 2012-10-12 NOTE — Progress Notes (Signed)
Subjective: Here for f/u on ADHD, medication.   Since last visit had been off Adderall to try and conceive through in vitro fertilization, but had 2 separate miscarriages. They decided to take a break again from trying one last try at in vitro.  Has restarted back on Adderall.  Although it does ok, still having issues with focus, organization, getting distracted.  Will have times where she writes down a thought or task, but gets stuck thinking about that tasks before moving onto the next tasks at hand.   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  At times loses train of thought talking to the judge.  Feels like she needs different strategy to deal with her focus problems and ease of distractibility.  Since last visit she saw dermatology and was prescribed trental which seems to give her improvement in her raynaud's.  No other c/o.  The following portions of the patient's history were reviewed and updated as appropriate: allergies, current medications, past family history, past medical history, past social history, past surgical history and problem list.  Past Medical History  Diagnosis Date  . Headache(784.0)   . Anxiety   . ADHD (attention deficit hyperactivity disorder)   . IBS (irritable bowel syndrome)     lactose intol  . Complication of anesthesia 93    93hallucinations with anesthesia, 2001-slow to awaken  . PONV (postoperative nausea and vomiting)   . Wears glasses   . Polyarthralgia     knees, hips, fingers bilat  . Female infertility   . H/O bilateral salpingectomy     ovaries and uterus still present  . Insomnia   . Raynaud phenomenon     Dr. Kellie Simmering, rheumatology consult 08/2011; +ANA    No Known Allergies   Review of Systems ROS reviewed and was negative other than noted in HPI or above.    Objective:   Physical Exam  General appearance: alert, no distress, WD/WN, lean white female   Assessment and Plan :     Encounter Diagnoses  Name  Primary?  . ADHD (attention deficit hyperactivity disorder) Yes  . Raynaud phenomenon    ADHD - discussed organization, having a consistent daily routine, prioritizing tasks, getting regular exercise, trying mediation or yoga.  Change to regular release Adderall BID.  Gave her flexibility to start 15mg  1/2 tablet BID, but increase to 1 tablet po BID going forward if needed.  F/u 73mo  raynaud's - improved on Trental per dermatology, Dr.Hall  Expressed my sympathy for her miscarriage and set backs.  They willl put off trying again with in vitro with their last remaining egg.  They are considering adoption as well.

## 2012-10-12 NOTE — Patient Instructions (Addendum)
Stop Adderall 20mg  XR daily.  Lets try some other strategies:  Begin Adderall 15mg .  You can start 1/2 tablet twice daily.  Take it around 8:30am and 1:30pm for starters.    Give this a week or 2 to see how you do.  You can keep it 1/2 table twice daily or potentially go up to 1 tablet twice daily  On days when work load is lighter or if working 1/2 day, consider backing off medication when possible  Be consistent with organization, scheduling, focus on the most important things first.     Exercise!  Get good sleep.

## 2012-11-12 ENCOUNTER — Telehealth: Payer: Self-pay | Admitting: Medical

## 2012-11-12 NOTE — Telephone Encounter (Signed)
LM

## 2013-04-27 ENCOUNTER — Telehealth: Payer: Self-pay | Admitting: Medical

## 2013-04-27 ENCOUNTER — Ambulatory Visit (INDEPENDENT_AMBULATORY_CARE_PROVIDER_SITE_OTHER): Payer: BC Managed Care – PPO | Admitting: Medical

## 2013-04-27 ENCOUNTER — Encounter: Payer: Self-pay | Admitting: Medical

## 2013-04-27 ENCOUNTER — Encounter: Payer: BC Managed Care – PPO | Admitting: Medical

## 2013-04-27 VITALS — BP 100/80 | HR 102 | Temp 98.1°F | Resp 16 | Wt 149.0 lb

## 2013-04-27 DIAGNOSIS — I7300 Raynaud's syndrome without gangrene: Secondary | ICD-10-CM

## 2013-04-27 DIAGNOSIS — F909 Attention-deficit hyperactivity disorder, unspecified type: Secondary | ICD-10-CM

## 2013-04-27 MED ORDER — AMPHETAMINE-DEXTROAMPHETAMINE 15 MG PO TABS: 15.0000 mg | ORAL_TABLET | Freq: Two times a day (BID) | ORAL | Status: DC

## 2013-04-27 MED ORDER — AMPHETAMINE-DEXTROAMPHETAMINE 15 MG PO TABS
15.0000 mg | ORAL_TABLET | Freq: Two times a day (BID) | ORAL | Status: DC
Start: 2013-04-27 — End: 2013-04-27

## 2013-04-27 MED ORDER — NIFEDIPINE ER OSMOTIC RELEASE 30 MG PO TB24: 30.0000 mg | ORAL_TABLET | Freq: Every day | ORAL | Status: DC

## 2013-04-27 NOTE — Telephone Encounter (Signed)
After considering options, I sent a medication to pharmacy called Nifedipine once daily extended release.  This is a calcium channel blocker BP medication, but used for raynaud's.  It opens up peripheral circulation.   Main side effects would be drop in BP, dizziness, swelling in legs.   This is the lowest dose.    Lets have her start this once daily in the morning.  Consider not starting until she has a day off or the weekend to see how she reacts to this.    Lets plan a recheck on this in 61mo.

## 2013-04-27 NOTE — Progress Notes (Signed)
Subjective: Here for f/u on ADHD, medication.   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  She actually feels less stressed in her new job, sleeping much better, doing really well.  Had a wonderful Christmas took some time off, saw family.  Her medication still works fine. Taking Adderall 15 mg regular release usually twice daily .  However there are days when she only takes it once a day or not at all if there is not a lot going on where she has to be focused.  The higher dose 20 mg seemed to make her more on edge, easier to snap per her husband.  Using humidifier in her room which has been helpful.    She has seen both rheumatology and dermatology and was prescribed trental which seems to give her improvement in her raynaud's.  However given the fact that is 3 times a day she is not taking it. Too hard to remember to take it.  Now that the weather is colder she is having more problems with the fingers and toes in ears and nose.  Wearing gloves, using lotion, avoiding cold weather when possible. No other c/o.  The following portions of the patient's history were reviewed and updated as appropriate: allergies, current medications, past family history, past medical history, past social history, past surgical history and problem list.  Past Medical History  Diagnosis Date  . Headache(784.0)   . Anxiety   . ADHD (attention deficit hyperactivity disorder)   . IBS (irritable bowel syndrome)     lactose intol  . Complication of anesthesia 93    93hallucinations with anesthesia, 2001-slow to awaken  . PONV (postoperative nausea and vomiting)   . Wears glasses   . Polyarthralgia     knees, hips, fingers bilat  . Female infertility   . H/O bilateral salpingectomy     ovaries and uterus still present  . Insomnia   . Raynaud phenomenon     Dr. Kellie Simmeringruslow, rheumatology consult 08/2011; +ANA    No Known Allergies   Review of Systems ROS reviewed and was negative other than  noted in HPI or above.    Objective:   Physical Exam  Objective: Filed Vitals:   04/27/13 0855  BP: 100/80  Pulse: 102  Temp: 98.1 F (36.7 C)  Resp: 16    General appearance: alert, no distress, WD/WN Neck: supple, no lymphadenopathy, no thyromegaly, no masses Heart: RRR, normal S1, S2, no murmurs Lungs: CTA bilaterally, no wheezes, rhonchi, or rales Pulses: 2+ symmetric, upper and lower extremities, normal cap refill Ext: no edema, her hands and feet from mid hand and mid foot distally are cold, but no cyanosis, no cracking   Assessment: Encounter Diagnoses  Name Primary?  . ADHD (attention deficit hyperactivity disorder) Yes  . Raynaud disease      Plan: ADHD-continue Adderall 15mg  twice daily, doing well on this, seems to be doing even better with the new job, less stress. Raynauds - discussed prior treatments, supportive care, preventative measures, begin Nifedipine XR daily. Discussed risks/benefits of medications  Follow-up 6 months on ADHD

## 2013-04-28 ENCOUNTER — Telehealth: Payer: Self-pay | Admitting: *Deleted

## 2013-04-28 NOTE — Telephone Encounter (Signed)
Called patient twice today, left two messages on her VM asking her to please call back.

## 2013-04-28 NOTE — Telephone Encounter (Signed)
Left message for patient to return my call.

## 2013-06-28 ENCOUNTER — Other Ambulatory Visit: Payer: Self-pay | Admitting: Family Medicine

## 2013-06-28 ENCOUNTER — Telehealth: Payer: Self-pay | Admitting: Medical

## 2013-06-28 MED ORDER — NIFEDIPINE ER OSMOTIC RELEASE 30 MG PO TB24: 30.0000 mg | ORAL_TABLET | Freq: Every day | ORAL | Status: DC

## 2013-06-28 NOTE — Telephone Encounter (Signed)
Rx refills sent to her pharmacy for 30 days. CLS

## 2013-06-28 NOTE — Telephone Encounter (Signed)
Pt has a medcheck appt scheduled. Needs refill on Nifedipine sent into [facility].

## 2013-07-15 ENCOUNTER — Encounter: Payer: BC Managed Care – PPO | Admitting: Medical

## 2013-07-18 ENCOUNTER — Encounter: Payer: BC Managed Care – PPO | Admitting: Medical

## 2013-07-28 ENCOUNTER — Encounter: Payer: Self-pay | Admitting: Medical

## 2013-07-28 ENCOUNTER — Ambulatory Visit (INDEPENDENT_AMBULATORY_CARE_PROVIDER_SITE_OTHER): Payer: BC Managed Care – PPO | Admitting: Medical

## 2013-07-28 VITALS — BP 102/70 | HR 80 | Temp 98.1°F | Resp 18 | Wt 146.0 lb

## 2013-07-28 DIAGNOSIS — F909 Attention-deficit hyperactivity disorder, unspecified type: Secondary | ICD-10-CM

## 2013-07-28 DIAGNOSIS — I7300 Raynaud's syndrome without gangrene: Secondary | ICD-10-CM

## 2013-07-28 MED ORDER — NIFEDIPINE ER OSMOTIC RELEASE 30 MG PO TB24: 30.0000 mg | ORAL_TABLET | Freq: Every day | ORAL | Status: DC

## 2013-07-28 MED ORDER — AMPHETAMINE-DEXTROAMPHETAMINE 15 MG PO TABS: 15.0000 mg | ORAL_TABLET | Freq: Two times a day (BID) | ORAL | Status: DC

## 2013-07-28 NOTE — Progress Notes (Signed)
Subjective: Here for f/u on ADHD, medication.    Doing well on medication for ADHD.  New job is going well, learning lots of things, but there is a big learning curve.  Sleeping much better, doing really well.  Taking Adderall 15 mg regular release usually twice daily .  However there are days when she only takes it once a day or not at all if there is not a lot going on where she has to be focused.  The higher dose 20 mg seemed to make her more on edge, easier to snap per her husband.    Since last visit ended up seeing clinic over at [facility] for shingles.  Had few days of itching, then bad nausea and stomach ache, started getting rash on right elbow, rash was collection of bumps that got size of a quarter, ended up having some patches along medial upper arm mid shaft and close to axilla.  She does note hx/o chicken pox.  At the time was having change in stress, changes in job. Husband recently got new job as Conservator, museum/gallery, works part time at [employer].    Since last visit has significant improvement in Raynauds with Nifedipine XL once daily.  Takes it in the morning, but by 6pm feels the medication wears off.  When wears off, in the evening, feels fingertips get hot and feel like little sausages.  She has seen both rheumatology and dermatology and was prescribed trental which seems to give her improvement in her raynaud's.  However given the fact that is 3 times a day she is not taking it. Too hard to remember to take it.  Now that the weather is colder she is having more problems with the fingers and toes in ears and nose.  Wearing gloves in winter, using lotion, avoiding cold weather when possible.   Also since last visit was treated for bronchitis.  No other c/o.  The following portions of the patient's history were reviewed and updated as appropriate: allergies, current medications, past family history, past medical history, past social history, past surgical history and problem  list.  Past Medical History  Diagnosis Date  . Headache(784.0)   . Anxiety   . ADHD (attention deficit hyperactivity disorder)   . IBS (irritable bowel syndrome)     lactose intol  . Complication of anesthesia 93    93hallucinations with anesthesia, 2001-slow to awaken  . PONV (postoperative nausea and vomiting)   . Wears glasses   . Polyarthralgia     knees, hips, fingers bilat  . Female infertility   . H/O bilateral salpingectomy     ovaries and uterus still present  . Insomnia   . Raynaud phenomenon     Dr. Kellie Simmering, rheumatology consult 08/2011; +ANA    No Known Allergies   Review of Systems ROS reviewed and was negative other than noted in HPI or above.    Objective:   Physical Exam  Objective: Filed Vitals:   07/28/13 1509  BP: 102/70  Pulse: 80  Temp: 98.1 F (36.7 C)  Resp: 18    General appearance: alert, no distress, WD/WN Neck: supple, no lymphadenopathy, no thyromegaly, no masses Heart: RRR, normal S1, S2, no murmurs Lungs: CTA bilaterally, no wheezes, rhonchi, or rales Pulses: 2+ symmetric, upper and lower extremities, normal cap refill Ext: no edema, slight flushing appearance of bilat hands from mid hand to fingertips, but no cyanosis, no cracking   Assessment: Encounter Diagnoses  Name Primary?  Marland Kitchen  ADHD (attention deficit hyperactivity disorder) Yes  . Raynaud disease      Plan: ADHD-continue Adderall 15mg  twice daily, doing well on this, seems to be doing even better with the new job, less stress.  Raynauds - discussed prior treatments, supportive care, preventative measures, c/t Nifedipine XR daily.   Next visit plan Tdap vaccine, fasting updated labs.  Follow-up 6 months on ADHD

## 2013-08-22 ENCOUNTER — Ambulatory Visit (INDEPENDENT_AMBULATORY_CARE_PROVIDER_SITE_OTHER): Payer: PRIVATE HEALTH INSURANCE | Admitting: Family Medicine

## 2013-08-22 ENCOUNTER — Encounter: Payer: Self-pay | Admitting: Family Medicine

## 2013-08-22 VITALS — BP 108/62 | HR 64 | Temp 98.2°F | Wt 149.0 lb

## 2013-08-22 DIAGNOSIS — J039 Acute tonsillitis, unspecified: Secondary | ICD-10-CM

## 2013-08-22 NOTE — Progress Notes (Signed)
   Subjective:    Patient ID: Cheyenne [name], female    DOB: 09/15/1980, 33 y.o.   MRN: [id number]  HPI She complains of a five-day history this started with nasal congestion, dry cough, sore throat. The coughing has gotten worse but no fever, chills. She is not coughing up anything. She does have some slight sneezing but no itchy watery eyes rhinorrhea. She has taken a few antihistamines over the last several days.   Review of Systems     Objective:   Physical Exam alert and in no distress. Tympanic membranes and canals are normal. Throat is clear. Tonsils are slightly enlarged with a small amount of exudate. Neck is supple with slightly tender adenopathy, right greater than left, no thyromegaly. Cardiac exam shows a regular sinus rhythm without murmurs or gallops. Lungs are clear to auscultation.  Strep screen negative.      Assessment & Plan:  Acute tonsillitis - Plan: Rapid Strep A  information given concerning symptomatic care. If she is still having symptoms in 7-10 days, she will call for possible antibiotic.

## 2013-12-06 ENCOUNTER — Telehealth: Payer: Self-pay | Admitting: Internal Medicine

## 2013-12-06 NOTE — Telephone Encounter (Signed)
Faxed over medical records to [employer] @ [phone]

## 2014-01-26 ENCOUNTER — Telehealth: Payer: Self-pay | Admitting: Internal Medicine

## 2014-01-26 NOTE — Telephone Encounter (Signed)
Pt states she was suppose to come in for a 6 month follow-up on ADHD and fasting labs. However she is pregnancy and came off the med a few weeks ago. She is also going to her obgyn in the next couple weeks and wants to know if she can just get the lab work done there. Please advise pt. She has cancelled her appt for tomorrow

## 2014-01-26 NOTE — Telephone Encounter (Signed)
Find out what medication she is taking? At this point she should not be on nifedipine or the ADHD medication if pregnant.

## 2014-01-27 ENCOUNTER — Ambulatory Visit: Payer: BC Managed Care – PPO | Admitting: Medical

## 2014-01-27 NOTE — Telephone Encounter (Signed)
You did remind her not to be on the BP medication show was on correct?  This BP medication is actually for Raynaud's.

## 2014-01-27 NOTE — Telephone Encounter (Signed)
LMOM TO CB. CLS 

## 2014-01-27 NOTE — Telephone Encounter (Signed)
Patient states that she is not on any ADHD medication. She stop 2 months. She said her OB doctor put her Lovenox. She states that she will get her lab work at her OB/GYN office.

## 2014-01-27 NOTE — Telephone Encounter (Signed)
Patient is aware that she should not be taking BP medication

## 2014-02-15 LAB — OB RESULTS CONSOLE HIV ANTIBODY (ROUTINE TESTING): HIV: NONREACTIVE

## 2014-02-15 LAB — OB RESULTS CONSOLE HEPATITIS B SURFACE ANTIGEN: Hepatitis B Surface Ag: NEGATIVE

## 2014-02-24 LAB — OB RESULTS CONSOLE GC/CHLAMYDIA
Chlamydia: NEGATIVE
Gonorrhea: NEGATIVE

## 2014-02-24 LAB — OB RESULTS CONSOLE ANTIBODY SCREEN: ANTIBODY SCREEN: NEGATIVE

## 2014-03-05 LAB — OB RESULTS CONSOLE ABO/RH: RH TYPE: POSITIVE

## 2014-04-21 NOTE — L&D Delivery Note (Signed)
Delivery Note At 3:06 PM a viable and healthy female was delivered via Vaginal, Spontaneous Delivery (Presentation: right Occiput Anterior).  APGAR: 8, 9; weight 9 lb 5 oz (4225 g).   Placenta status: Intact, Spontaneous.  Cord: 3 vessels with the following complications: loose [place] reduced on perineum.  Cord pH: na  Anesthesia: Epidural  Episiotomy: None Lacerations: 3rd degree Suture Repair: 2.0 3.0 vicryl rapide and O vicryl for sphincter repair Est. Blood Loss (mL): 285  Mom to postpartum.  Baby to Couplet care / Skin to Skin.  Giordano Getman J 09/19/2014, 6:04 PM

## 2014-07-03 LAB — OB RESULTS CONSOLE RPR: RPR: NONREACTIVE

## 2014-08-23 LAB — OB RESULTS CONSOLE GBS: STREP GROUP B AG: NEGATIVE

## 2014-09-04 ENCOUNTER — Encounter (HOSPITAL_COMMUNITY): Payer: Self-pay | Admitting: *Deleted

## 2014-09-04 ENCOUNTER — Telehealth (HOSPITAL_COMMUNITY): Payer: Self-pay | Admitting: *Deleted

## 2014-09-04 NOTE — Telephone Encounter (Signed)
Preadmission screen  

## 2014-09-10 ENCOUNTER — Inpatient Hospital Stay (HOSPITAL_COMMUNITY): Admission: RE | Admit: 2014-09-10 | Payer: BC Managed Care – PPO | Source: Ambulatory Visit

## 2014-09-18 ENCOUNTER — Inpatient Hospital Stay (HOSPITAL_COMMUNITY)
Admission: AD | Admit: 2014-09-18 | Discharge: 2014-09-21 | DRG: 988 | Disposition: A | Discharge status: Home / Self Care | Payer: Managed Care, Other (non HMO) | Source: Ambulatory Visit | Attending: Obstetrics and Gynecology | Admitting: Obstetrics and Gynecology

## 2014-09-18 ENCOUNTER — Other Ambulatory Visit: Payer: Self-pay | Admitting: Obstetrics and Gynecology

## 2014-09-18 ENCOUNTER — Encounter (HOSPITAL_COMMUNITY): Payer: Self-pay | Admitting: *Deleted

## 2014-09-18 DIAGNOSIS — O9962 Diseases of the digestive system complicating childbirth: Secondary | ICD-10-CM | POA: Diagnosis present

## 2014-09-18 DIAGNOSIS — O99344 Other mental disorders complicating childbirth: Secondary | ICD-10-CM | POA: Diagnosis present

## 2014-09-18 DIAGNOSIS — O368130 Decreased fetal movements, third trimester, not applicable or unspecified: Secondary | ICD-10-CM | POA: Diagnosis present

## 2014-09-18 DIAGNOSIS — D62 Acute posthemorrhagic anemia: Secondary | ICD-10-CM | POA: Diagnosis not present

## 2014-09-18 DIAGNOSIS — O9903 Anemia complicating the puerperium: Secondary | ICD-10-CM | POA: Diagnosis present

## 2014-09-18 DIAGNOSIS — D6859 Other primary thrombophilia: Secondary | ICD-10-CM | POA: Diagnosis present

## 2014-09-18 DIAGNOSIS — O403XX0 Polyhydramnios, third trimester, not applicable or unspecified: Secondary | ICD-10-CM | POA: Diagnosis present

## 2014-09-18 DIAGNOSIS — O99413 Diseases of the circulatory system complicating pregnancy, third trimester: Secondary | ICD-10-CM | POA: Diagnosis present

## 2014-09-18 DIAGNOSIS — Z7901 Long term (current) use of anticoagulants: Secondary | ICD-10-CM | POA: Diagnosis not present

## 2014-09-18 DIAGNOSIS — F419 Anxiety disorder, unspecified: Secondary | ICD-10-CM | POA: Diagnosis present

## 2014-09-18 DIAGNOSIS — O872 Hemorrhoids in the puerperium: Secondary | ICD-10-CM | POA: Diagnosis not present

## 2014-09-18 DIAGNOSIS — Z9079 Acquired absence of other genital organ(s): Secondary | ICD-10-CM | POA: Diagnosis present

## 2014-09-18 DIAGNOSIS — O99113 Other diseases of the blood and blood-forming organs and certain disorders involving the immune mechanism complicating pregnancy, third trimester: Secondary | ICD-10-CM | POA: Diagnosis present

## 2014-09-18 DIAGNOSIS — K589 Irritable bowel syndrome without diarrhea: Secondary | ICD-10-CM | POA: Diagnosis present

## 2014-09-18 DIAGNOSIS — O09293 Supervision of pregnancy with other poor reproductive or obstetric history, third trimester: Secondary | ICD-10-CM

## 2014-09-18 DIAGNOSIS — Z9049 Acquired absence of other specified parts of digestive tract: Secondary | ICD-10-CM | POA: Diagnosis present

## 2014-09-18 DIAGNOSIS — O09813 Supervision of pregnancy resulting from assisted reproductive technology, third trimester: Secondary | ICD-10-CM

## 2014-09-18 DIAGNOSIS — Z3A39 39 weeks gestation of pregnancy: Secondary | ICD-10-CM | POA: Diagnosis present

## 2014-09-18 DIAGNOSIS — O9912 Other diseases of the blood and blood-forming organs and certain disorders involving the immune mechanism complicating childbirth: Secondary | ICD-10-CM | POA: Diagnosis present

## 2014-09-18 DIAGNOSIS — O99119 Other diseases of the blood and blood-forming organs and certain disorders involving the immune mechanism complicating pregnancy, unspecified trimester: Secondary | ICD-10-CM

## 2014-09-18 DIAGNOSIS — I7300 Raynaud's syndrome without gangrene: Secondary | ICD-10-CM | POA: Diagnosis present

## 2014-09-18 DIAGNOSIS — D509 Iron deficiency anemia, unspecified: Secondary | ICD-10-CM | POA: Diagnosis present

## 2014-09-18 LAB — CBC
HCT: 30.3 % — ABNORMAL LOW (ref 36.0–46.0)
Hemoglobin: 9.7 g/dL — ABNORMAL LOW (ref 12.0–15.0)
MCH: 27.6 pg (ref 26.0–34.0)
MCHC: 32 g/dL (ref 30.0–36.0)
MCV: 86.3 fL (ref 78.0–100.0)
Platelets: 215 10*3/uL (ref 150–400)
RBC: 3.51 MIL/uL — AB (ref 3.87–5.11)
RDW: 13.7 % (ref 11.5–15.5)
WBC: 12 10*3/uL — ABNORMAL HIGH (ref 4.0–10.5)

## 2014-09-18 LAB — TYPE AND SCREEN
ABO/RH(D): A POS
Antibody Screen: NEGATIVE

## 2014-09-18 MED ORDER — OXYTOCIN BOLUS FROM INFUSION: 500.0000 mL | INTRAVENOUS | Status: DC

## 2014-09-18 MED ORDER — DIPHENHYDRAMINE HCL 50 MG/ML IJ SOLN
12.5000 mg | INTRAMUSCULAR | Status: DC | PRN
Start: 2014-09-18 — End: 2014-09-19

## 2014-09-18 MED ORDER — TERBUTALINE SULFATE 1 MG/ML IJ SOLN: 0.2500 mg | Freq: Once | INTRAMUSCULAR | Status: AC | PRN

## 2014-09-18 MED ORDER — PHENYLEPHRINE 40 MCG/ML (10ML) SYRINGE FOR IV PUSH (FOR BLOOD PRESSURE SUPPORT)
80.0000 ug | PREFILLED_SYRINGE | INTRAVENOUS | Status: DC | PRN
  Filled 2014-09-18: qty 2
  Filled 2014-09-18: qty 20

## 2014-09-18 MED ORDER — ONDANSETRON HCL 4 MG/2ML IJ SOLN: 4.0000 mg | Freq: Four times a day (QID) | INTRAMUSCULAR | Status: DC | PRN

## 2014-09-18 MED ORDER — OXYCODONE-ACETAMINOPHEN 5-325 MG PO TABS: 1.0000 | ORAL_TABLET | ORAL | Status: DC | PRN

## 2014-09-18 MED ORDER — OXYCODONE-ACETAMINOPHEN 5-325 MG PO TABS: 2.0000 | ORAL_TABLET | ORAL | Status: DC | PRN

## 2014-09-18 MED ORDER — ZOLPIDEM TARTRATE 5 MG PO TABS
5.0000 mg | ORAL_TABLET | Freq: Every evening | ORAL | Status: DC | PRN
  Administered 2014-09-19: 5 mg via ORAL
  Filled 2014-09-18: qty 1

## 2014-09-18 MED ORDER — LACTATED RINGERS IV SOLN
500.0000 mL | INTRAVENOUS | Status: DC | PRN
  Administered 2014-09-19 (×2): 500 mL via INTRAVENOUS

## 2014-09-18 MED ORDER — CITRIC ACID-SODIUM CITRATE 334-500 MG/5ML PO SOLN: 30.0000 mL | ORAL | Status: DC | PRN

## 2014-09-18 MED ORDER — MISOPROSTOL 25 MCG QUARTER TABLET
25.0000 ug | ORAL_TABLET | ORAL | Status: DC | PRN
  Administered 2014-09-18 – 2014-09-19 (×3): 25 ug via VAGINAL
  Filled 2014-09-18: qty 1
  Filled 2014-09-18 (×3): qty 0.25

## 2014-09-18 MED ORDER — LACTATED RINGERS IV SOLN
INTRAVENOUS | Status: DC
  Administered 2014-09-18 – 2014-09-19 (×3): via INTRAVENOUS

## 2014-09-18 MED ORDER — ACETAMINOPHEN 325 MG PO TABS: 650.0000 mg | ORAL_TABLET | ORAL | Status: DC | PRN

## 2014-09-18 MED ORDER — FLEET ENEMA 7-19 GM/118ML RE ENEM: 1.0000 | ENEMA | RECTAL | Status: DC | PRN

## 2014-09-18 MED ORDER — LIDOCAINE HCL (PF) 1 % IJ SOLN
30.0000 mL | INTRAMUSCULAR | Status: DC | PRN
  Administered 2014-09-19: 30 mL via SUBCUTANEOUS
  Filled 2014-09-18: qty 30

## 2014-09-18 MED ORDER — OXYTOCIN 40 UNITS IN LACTATED RINGERS INFUSION - SIMPLE MED
1.0000 m[IU]/min | INTRAVENOUS | Status: DC
  Administered 2014-09-19: 2 m[IU]/min via INTRAVENOUS
  Filled 2014-09-18: qty 1000

## 2014-09-18 MED ORDER — OXYTOCIN 40 UNITS IN LACTATED RINGERS INFUSION - SIMPLE MED
62.5000 mL/h | INTRAVENOUS | Status: DC
  Administered 2014-09-19: 62.5 mL/h via INTRAVENOUS

## 2014-09-18 MED ORDER — EPHEDRINE 5 MG/ML INJ
10.0000 mg | INTRAVENOUS | Status: DC | PRN
  Filled 2014-09-18: qty 2

## 2014-09-18 MED ORDER — FENTANYL 2.5 MCG/ML BUPIVACAINE 1/10 % EPIDURAL INFUSION (WH - ANES)
14.0000 mL/h | INTRAMUSCULAR | Status: DC | PRN
  Administered 2014-09-19: 14 mL/h via EPIDURAL
  Filled 2014-09-18: qty 125

## 2014-09-18 NOTE — H&P (Signed)
Cheyenne [name] is a 34 y.o. female presenting for IOL. 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 Pregnancy complicated by decreased fetal movement and polyhydramnios. On Lovenox. No bleeding. IOL scheduled at 38 weeks. Approved by MFM. Cancelled by Dr. Shawnie Pons.  Maternal Medical History:  Contractions: Onset was less than 1 hour ago.   Frequency: rare.   Perceived severity is mild.    Fetal activity: Perceived fetal activity is normal.   Last perceived fetal movement was within the past hour.    Prenatal complications: Polyhydramnios and thrombophilia.   Prenatal Complications - Diabetes: none.    OB History    Gravida Para Term Preterm AB TAB SAB Ectopic Multiple Living   1              Past Medical History  Diagnosis Date  . Headache(784.0)   . Anxiety   . ADHD (attention deficit hyperactivity disorder)   . IBS (irritable bowel syndrome)     lactose intol  . Complication of anesthesia 93    93hallucinations with anesthesia, 2001-slow to awaken  . PONV (postoperative nausea and vomiting)   . Wears glasses   . Polyarthralgia     knees, hips, fingers bilat  . Female infertility   . H/O bilateral salpingectomy     ovaries and uterus still present  . Insomnia   . Raynaud phenomenon     Dr. Kellie Simmering, rheumatology consult 08/2011; +ANA  . ADHD (attention deficit hyperactivity disorder)    Past Surgical History  Procedure Laterality Date  . Appendectomy  93  . Incise and drain abcess  93    hallucinations with anesthesia  . Diagnostic laparoscopy  2001  . Cholecystectomy  2003    ponv   Family History: family history includes Cancer in her maternal grandmother and paternal grandfather; Diabetes in her father and paternal grandfather; Heart disease in her maternal uncle. Social History:  reports that she quit smoking about 6 years ago. Her smoking use included Cigarettes. She quit after 10 years of use. She does not have any smokeless  tobacco history on file. She reports that she drinks alcohol. She reports that she does not use illicit drugs.   Prenatal Transfer Tool  Maternal Diabetes: No Genetic Screening: Normal Maternal Ultrasounds/Referrals: Abnormal:  Findings:   Other: polyhydramnios Fetal Ultrasounds or other Referrals:  None Maternal Substance Abuse:  No Significant Maternal Medications:  Meds include: Other: Nifedipine for Raynauds Significant Maternal Lab Results:  None Other Comments:  None  Review of Systems  Constitutional: Negative.   Respiratory: Negative.   Cardiovascular: Negative.   Gastrointestinal: Negative.   Genitourinary: Negative.   Skin: Negative.   All other systems reviewed and are negative.     There were no vitals taken for this visit. Maternal Exam:  Uterine Assessment: Contraction strength is mild.  Contraction frequency is rare.   Abdomen: Patient reports no abdominal tenderness. Fetal presentation: vertex  Introitus: Normal vulva. Normal vagina.  Ferning test: not done.  Nitrazine test: not done. Amniotic fluid character: not assessed.  Pelvis: adequate for delivery.   Cervix: Cervix evaluated by digital exam.     Physical Exam  Nursing note and vitals reviewed. Constitutional: She is oriented to person, place, and time. She appears well-developed and well-nourished.  HENT:  Head: Normocephalic and atraumatic.  Neck: Normal range of motion. Neck supple.  Cardiovascular: Normal rate and regular rhythm.   Respiratory: Effort normal and breath sounds normal.  GI: Soft. Bowel sounds are normal.  Genitourinary: Vagina normal and uterus normal.  Musculoskeletal: Normal range of motion.  Neurological: She is alert and oriented to person, place, and time. She has normal reflexes.  Skin: Skin is warm and dry. No erythema.  Psychiatric: She has a normal mood and affect. Her behavior is normal. Judgment and thought content normal.    Prenatal labs: ABO, Rh:   Antibody:  Negative (11/06 0000) Rubella:   RPR:    HBsAg:    HIV:    GBS: Negative (05/04 0000)   FHR reactive, category 1 No contractions Cytotec placed  Assessment/Plan: 39 week IUP Poor OB history with borderline cardiolipin antibodies on Lovenox Polyhydramnios Decreased FM Admit for IOL  Cytotec tonite  Jemmie Rhinehart J 09/18/2014, 9:18 AM

## 2014-09-19 ENCOUNTER — Inpatient Hospital Stay (HOSPITAL_COMMUNITY): Payer: Managed Care, Other (non HMO) | Admitting: Anesthesiology

## 2014-09-19 ENCOUNTER — Encounter (HOSPITAL_COMMUNITY): Payer: Self-pay | Admitting: Anesthesiology

## 2014-09-19 LAB — HIV ANTIBODY (ROUTINE TESTING W REFLEX): HIV Screen 4th Generation wRfx: NONREACTIVE

## 2014-09-19 LAB — RUBELLA SCREEN: Rubella: 1.22 index (ref >0.99)

## 2014-09-19 MED ORDER — PRENATAL MULTIVITAMIN CH
1.0000 | ORAL_TABLET | Freq: Every day | ORAL | Status: DC
  Administered 2014-09-20: 1 via ORAL
  Filled 2014-09-19: qty 1

## 2014-09-19 MED ORDER — ZOLPIDEM TARTRATE 5 MG PO TABS: 5.0000 mg | ORAL_TABLET | Freq: Every evening | ORAL | Status: DC | PRN

## 2014-09-19 MED ORDER — FENTANYL 2.5 MCG/ML BUPIVACAINE 1/10 % EPIDURAL INFUSION (WH - ANES)
14.0000 mL/h | INTRAMUSCULAR | Status: DC | PRN
  Administered 2014-09-19: 14 mL/h via EPIDURAL

## 2014-09-19 MED ORDER — IBUPROFEN 600 MG PO TABS
600.0000 mg | ORAL_TABLET | Freq: Four times a day (QID) | ORAL | Status: DC
  Administered 2014-09-19 – 2014-09-21 (×7): 600 mg via ORAL
  Filled 2014-09-19 (×7): qty 1

## 2014-09-19 MED ORDER — METHYLERGONOVINE MALEATE 0.2 MG/ML IJ SOLN: 0.2000 mg | INTRAMUSCULAR | Status: DC | PRN

## 2014-09-19 MED ORDER — TETANUS-DIPHTH-ACELL PERTUSSIS 5-2.5-18.5 LF-MCG/0.5 IM SUSP: 0.5000 mL | Freq: Once | INTRAMUSCULAR | Status: DC

## 2014-09-19 MED ORDER — LIDOCAINE HCL (PF) 1 % IJ SOLN
INTRAMUSCULAR | Status: DC | PRN
  Administered 2014-09-19 (×2): 4 mL

## 2014-09-19 MED ORDER — ONDANSETRON HCL 4 MG/2ML IJ SOLN: 4.0000 mg | INTRAMUSCULAR | Status: DC | PRN

## 2014-09-19 MED ORDER — WITCH HAZEL-GLYCERIN EX PADS
1.0000 "application " | MEDICATED_PAD | CUTANEOUS | Status: DC | PRN
  Administered 2014-09-19: 1 via TOPICAL

## 2014-09-19 MED ORDER — DIPHENHYDRAMINE HCL 50 MG/ML IJ SOLN: 12.5000 mg | INTRAMUSCULAR | Status: DC | PRN

## 2014-09-19 MED ORDER — DIPHENHYDRAMINE HCL 25 MG PO CAPS: 25.0000 mg | ORAL_CAPSULE | Freq: Four times a day (QID) | ORAL | Status: DC | PRN

## 2014-09-19 MED ORDER — OXYCODONE-ACETAMINOPHEN 5-325 MG PO TABS
1.0000 | ORAL_TABLET | ORAL | Status: DC | PRN
  Filled 2014-09-19: qty 1

## 2014-09-19 MED ORDER — ONDANSETRON HCL 4 MG PO TABS: 4.0000 mg | ORAL_TABLET | ORAL | Status: DC | PRN

## 2014-09-19 MED ORDER — LANOLIN HYDROUS EX OINT: TOPICAL_OINTMENT | CUTANEOUS | Status: DC | PRN

## 2014-09-19 MED ORDER — ACETAMINOPHEN 325 MG PO TABS
650.0000 mg | ORAL_TABLET | ORAL | Status: DC | PRN
  Administered 2014-09-20 (×3): 650 mg via ORAL
  Filled 2014-09-19 (×3): qty 2

## 2014-09-19 MED ORDER — SENNOSIDES-DOCUSATE SODIUM 8.6-50 MG PO TABS
2.0000 | ORAL_TABLET | ORAL | Status: DC
  Administered 2014-09-19: 2 via ORAL
  Administered 2014-09-20: 1 via ORAL
  Filled 2014-09-19 (×2): qty 2

## 2014-09-19 MED ORDER — BENZOCAINE-MENTHOL 20-0.5 % EX AERO
1.0000 "application " | INHALATION_SPRAY | CUTANEOUS | Status: DC | PRN
  Administered 2014-09-19: 1 via TOPICAL
  Filled 2014-09-19: qty 56

## 2014-09-19 MED ORDER — SIMETHICONE 80 MG PO CHEW: 80.0000 mg | CHEWABLE_TABLET | ORAL | Status: DC | PRN

## 2014-09-19 MED ORDER — DIBUCAINE 1 % RE OINT
1.0000 "application " | TOPICAL_OINTMENT | RECTAL | Status: DC | PRN
  Administered 2014-09-19: 1 via RECTAL
  Filled 2014-09-19: qty 28

## 2014-09-19 MED ORDER — OXYCODONE-ACETAMINOPHEN 5-325 MG PO TABS: 2.0000 | ORAL_TABLET | ORAL | Status: DC | PRN

## 2014-09-19 MED ORDER — PHENYLEPHRINE 40 MCG/ML (10ML) SYRINGE FOR IV PUSH (FOR BLOOD PRESSURE SUPPORT)
80.0000 ug | PREFILLED_SYRINGE | INTRAVENOUS | Status: DC | PRN
  Filled 2014-09-19: qty 2

## 2014-09-19 MED ORDER — METHYLERGONOVINE MALEATE 0.2 MG PO TABS: 0.2000 mg | ORAL_TABLET | ORAL | Status: DC | PRN

## 2014-09-19 NOTE — Anesthesia Preprocedure Evaluation (Signed)
Anesthesia Evaluation  Patient identified by MRN, date of birth, ID band Patient awake    Reviewed: Allergy & Precautions, Patient's Chart, lab work & pertinent test results  History of Anesthesia Complications (+) PONV, Emergence Delirium and history of anesthetic complications  Airway Mallampati: III  TM Distance: >3 FB Neck ROM: Full    Dental no notable dental hx. (+) Teeth Intact   Pulmonary former smoker,  breath sounds clear to auscultation  Pulmonary exam normal       Cardiovascular negative cardio ROS Normal cardiovascular examRhythm:Regular Rate:Normal  Hx/o Raynaud's phenomenon   Neuro/Psych  Headaches, PSYCHIATRIC DISORDERS Anxiety ADHD   GI/Hepatic Neg liver ROS, GERD-  ,IBS   Endo/Other  Obesity  Renal/GU negative Renal ROS  negative genitourinary   Musculoskeletal Polyarthralgias + ANA   Abdominal (+) + obese,   Peds  Hematology  (+) anemia , On Lovenox for recurrent SAB- last dose 5/29 pm   Anesthesia Other Findings   Reproductive/Obstetrics (+) Pregnancy                             Anesthesia Physical Anesthesia Plan  ASA: II  Anesthesia Plan: Epidural   Post-op Pain Management:    Induction:   Airway Management Planned: Natural Airway  Additional Equipment:   Intra-op Plan:   Post-operative Plan:   Informed Consent: I have reviewed the patients History and Physical, chart, labs and discussed the procedure including the risks, benefits and alternatives for the proposed anesthesia with the patient or authorized representative who has indicated his/her understanding and acceptance.     Plan Discussed with: Anesthesiologist  Anesthesia Plan Comments:         Anesthesia Quick Evaluation

## 2014-09-19 NOTE — Progress Notes (Signed)
Cheyenne [name] is a 34 y.o. G3P0000 at 6237w2d by LMP admitted for induction of labor due to Hydramnios.  Subjective: occ pressure  Objective: BP 117/75 mmHg  Pulse 75  Temp(Src) 98 F (36.7 C) (Axillary)  Resp 20  Ht 5\' 7"  (1.702 m)  Wt 93.441 kg (206 lb)  BMI 32.26 kg/m2  SpO2 100%      FHT:  FHR: 130 bpm, variability: moderate,  accelerations:  Present,  decelerations:  Absent UC:   regular, every 3 minutes SVE:   9-10/ROP/+1  Labs: Lab Results  Component Value Date   WBC 12.0* 09/18/2014   HGB 9.7* 09/18/2014   HCT 30.3* 09/18/2014   MCV 86.3 09/18/2014   PLT 215 09/18/2014    Assessment / Plan: Induction of [facility] to [facility] conditions and poly,  progressing well on pitocin  Occiput Posterior  Labor: Progressing normally- exaggerated sims Preeclampsia:  no signs or symptoms of toxicity Fetal Wellbeing:  Category I Pain Control:  Epidural I/D:  n/a Anticipated MOD:  NSVD  Geryl Dohn J 09/19/2014, 1:26 PM

## 2014-09-19 NOTE — Progress Notes (Signed)
Cheyenne [name] is a 34 y.o. G3P0000 at 8187w2d by LMP admitted for induction of labor due to Hydramnios.  Subjective: Crampy  Objective: BP 110/55 mmHg  Pulse 71  Temp(Src) 97.7 F (36.5 C) (Oral)  Resp 18  Ht 5\' 7"  (1.702 m)  Wt 93.441 kg (206 lb)  BMI 32.26 kg/m2      FHT:  FHR: 145 bpm, variability: moderate,  accelerations:  Present,  decelerations:  Absent UC:   irregular, every 1-5 minutes SVE:   3-4/80/-1 aROM- clear  Labs: Lab Results  Component Value Date   WBC 12.0* 09/18/2014   HGB 9.7* 09/18/2014   HCT 30.3* 09/18/2014   MCV 86.3 09/18/2014   PLT 215 09/18/2014    Assessment / Plan: In[facility] of [facility] to [facility] conditions,  progressing well on pitocin  Labor: Progressing normally Preeclampsia:  no signs or symptoms of toxicity Fetal Wellbeing:  Category I Pain Control:  Labor support without medications I/D:  n/a Anticipated MOD:  NSVD  Cheyenne [name] J 09/19/2014, 7:05 AM

## 2014-09-19 NOTE — Anesthesia Procedure Notes (Signed)
Epidural Patient location during procedure: OB Start time: 09/19/2014 8:26 AM  Staffing Anesthesiologist: Mal AmabileFOSTER, Tallie Dodds Performed by: anesthesiologist   Preanesthetic Checklist Completed: patient identified, site marked, surgical consent, pre-op evaluation, timeout performed, IV checked, risks and benefits discussed and monitors and equipment checked  Epidural Patient position: sitting Prep: site prepped and draped and DuraPrep Patient monitoring: continuous pulse ox and blood pressure Approach: midline Location: L4-L5 Injection technique: LOR air  Needle:  Needle type: Tuohy  Needle gauge: 17 G Needle length: 9 cm and 9 Needle insertion depth: 6 cm Catheter type: closed end flexible Catheter size: 19 Gauge Catheter at skin depth: 11 cm Test dose: negative and Other  Assessment Events: blood not aspirated, injection not painful, no injection resistance, negative IV test and no paresthesia  Additional Notes Patient identified. Risks and benefits discussed including failed block, incomplete  Pain control, post dural puncture headache, nerve damage, paralysis, blood pressure Changes, nausea, vomiting, reactions to medications-both toxic and allergic and post Partum back pain. All questions were answered. Patient expressed understanding and wished to proceed. Sterile technique was used throughout procedure. Epidural site was Dressed with sterile barrier dressing. No paresthesias, signs of intravascular injection Or signs of intrathecal spread were encountered.  Patient was more comfortable after the epidural was dosed. Please see RN's note for documentation of vital signs and FHR which are stable.

## 2014-09-20 LAB — CBC
HEMATOCRIT: 24.9 % — AB (ref 36.0–46.0)
HEMOGLOBIN: 8 g/dL — AB (ref 12.0–15.0)
MCH: 27.6 pg (ref 26.0–34.0)
MCHC: 32.1 g/dL (ref 30.0–36.0)
MCV: 85.9 fL (ref 78.0–100.0)
Platelets: 185 10*3/uL (ref 150–400)
RBC: 2.9 MIL/uL — AB (ref 3.87–5.11)
RDW: 13.8 % (ref 11.5–15.5)
WBC: 14.9 10*3/uL — AB (ref 4.0–10.5)

## 2014-09-20 LAB — RPR: RPR: NONREACTIVE

## 2014-09-20 MED ORDER — POLYSACCHARIDE IRON COMPLEX 150 MG PO CAPS
150.0000 mg | ORAL_CAPSULE | Freq: Two times a day (BID) | ORAL | Status: DC
  Administered 2014-09-20 – 2014-09-21 (×3): 150 mg via ORAL
  Filled 2014-09-20 (×3): qty 1

## 2014-09-20 MED ORDER — POLYETHYLENE GLYCOL 3350 17 G PO PACK
17.0000 g | PACK | Freq: Every day | ORAL | Status: DC
  Administered 2014-09-20: 17 g via ORAL
  Filled 2014-09-20 (×2): qty 1

## 2014-09-20 MED ORDER — HYDROCODONE-ACETAMINOPHEN 5-325 MG PO TABS
1.0000 | ORAL_TABLET | ORAL | Status: DC | PRN
  Administered 2014-09-20 – 2014-09-21 (×5): 1 via ORAL
  Filled 2014-09-20 (×5): qty 1

## 2014-09-20 MED ORDER — DOCUSATE SODIUM 100 MG PO CAPS
100.0000 mg | ORAL_CAPSULE | Freq: Two times a day (BID) | ORAL | Status: DC
  Administered 2014-09-20: 100 mg via ORAL
  Filled 2014-09-20 (×2): qty 1

## 2014-09-20 NOTE — Progress Notes (Signed)
Patient ID: Legrand RamsKathryn M Hochman, female   DOB: 01/20/1981, 34 y.o.   MRN: [id number] Patient in NICU visiting newborn - awaiting NP d/c of newborn / CNM will round by 1300.  Raelyn MoraAWSON, Champagne Paletta, M MSN, CNM 09/20/2014 11:10 AM

## 2014-09-20 NOTE — Progress Notes (Signed)
Patient ID: Legrand RamsKathryn M [name], female   DOB: 03/30/1981, 34 y.o.   MRN: [id number] PPD # 1 SVD  S:  Reports feeling well             Tolerating po/ No nausea or vomiting             Bleeding is light             Pain controlled with ibuprofen (OTC)             Up ad lib / ambulatory / voiding without difficulties    Newborn  Information for the patient's newborn:  Erlene QuanGregg, Boy Sindia [[id number]][[id number]]  female  breast feeding  / Circumcision in progress   O:  A & O x 3, in no apparent distress              VS:  Filed Vitals:   09/19/14 1745 09/19/14 1851 09/19/14 2306 09/20/14 0536  BP: 113/73 113/71 109/70 107/56  Pulse: 85 85 74 71  Temp: 98.4 F (36.9 C) 98.5 F (36.9 C) 98.3 F (36.8 C) 98.2 F (36.8 C)  TempSrc: Oral Oral Oral Oral  Resp: 20 20 18 18   Height:      Weight:      SpO2:        LABS:  Recent Labs  09/18/14 1725 09/20/14 0605  WBC 12.0* 14.9*  HGB 9.7* 8.0*  HCT 30.3* 24.9*  PLT 215 185    Blood type: A POS (05/30 1725)  Rubella: Immune (05/30 1849)     Lungs: Clear and unlabored  Heart: regular rate and rhythm / no murmurs  Abdomen: soft, non-tender, non-distended              Fundus: firm, non-tender, U-1  Perineum: 3rd degree repair healing well, no edema  Lochia: minimal  Extremities: no edema, no calf pain or tenderness, no Homans    A/P: PPD # 1  33 y.o., G3P1001   Principal Problem:    Postpartum care following vaginal delivery (5/31)  Active Problems:  IVF pregnancy, delivered    Thrombophilia affecting pregnancy, antepartum  IDA with compounding ABL Anemia   Doing well - stable status  Routine post partum orders  Start bowel regimen  Anticipate discharge tomorrow    Raelyn MoraAWSON, Gretta Samons, M, MSN, CNM 09/20/2014, 1:22 PM

## 2014-09-20 NOTE — Lactation Note (Addendum)
This note was copied from the chart of Cheyenne Horton FinerKathryn [name]. Lactation Consultation Note  Patient Name: Cheyenne [name] ZOXWR'UToday's Date: 09/20/2014 Reason for consult: Follow-up assessment Baby 24 hour old[facility] and has not been to breast or supplemented with EBM/formula since 0900. Not able to assist mom to latch baby initially, because baby sleepy and mom states that she is a little overwhelmed with the transfer and baby about to have blood drawn. Enc mom to offer lots of STS and to nurse with cues. Enc mom to supplement with EBM/formula after baby at breast. Enc mom to post-pump after baby fed. Wrote feeding plan down for mom and left at bedside. After PKU, mom wanting to latch baby. So assisted to position mom, but baby because choked when being moved to breast. Enc mom to hold baby upright and pat baby's back for a few minutes until baby sounds clearer. Made a bottle of formula for mom to feed baby and enc mom to go ahead and supplement, then call out for assistance with nursing when baby cueing to feed. Discussed plan with patient's RN Cheyenne [name], and on-coming LC Cheyenne [name].   Maternal Data    Feeding    LATCH Score/Interventions                      Lactation Tools Discussed/Used     Consult Status Consult Status: Follow-up Date: 09/21/14 Follow-up type: In-patient    Cheyenne [name], Cheyenne [name] 09/20/2014, 3:52 PM

## 2014-09-21 ENCOUNTER — Ambulatory Visit: Payer: Self-pay

## 2014-09-21 MED ORDER — IBUPROFEN 800 MG PO TABS: 800.0000 mg | ORAL_TABLET | Freq: Three times a day (TID) | ORAL | Status: DC

## 2014-09-21 MED ORDER — POLYETHYLENE GLYCOL 3350 17 G PO PACK: 17.0000 g | PACK | Freq: Every day | ORAL | Status: DC | PRN

## 2014-09-21 MED ORDER — POLYETHYLENE GLYCOL 3350 17 G PO PACK
17.0000 g | PACK | Freq: Every day | ORAL | Status: DC | PRN
  Administered 2014-09-21: 17 g via ORAL
  Filled 2014-09-21 (×2): qty 1

## 2014-09-21 MED ORDER — DOCUSATE SODIUM 100 MG PO CAPS: 100.0000 mg | ORAL_CAPSULE | Freq: Two times a day (BID) | ORAL | Status: DC

## 2014-09-21 MED ORDER — MAGNESIUM OXIDE 400 (241.3 MG) MG PO TABS: 400.0000 mg | ORAL_TABLET | Freq: Every day | ORAL | Status: DC

## 2014-09-21 MED ORDER — POLYETHYLENE GLYCOL 3350 17 G PO PACK: 17.0000 g | PACK | ORAL | Status: DC | PRN

## 2014-09-21 MED ORDER — POLYSACCHARIDE IRON COMPLEX 150 MG PO CAPS: 150.0000 mg | ORAL_CAPSULE | Freq: Every day | ORAL | Status: DC

## 2014-09-21 MED ORDER — HYDROCORTISONE ACE-PRAMOXINE 2.5-1 % RE CREA: TOPICAL_CREAM | Freq: Four times a day (QID) | RECTAL | Status: DC

## 2014-09-21 MED ORDER — HYDROCODONE-ACETAMINOPHEN 5-325 MG PO TABS: 1.0000 | ORAL_TABLET | ORAL | Status: DC | PRN

## 2014-09-21 MED ORDER — MAGNESIUM OXIDE 400 (241.3 MG) MG PO TABS
400.0000 mg | ORAL_TABLET | Freq: Every day | ORAL | Status: DC
  Filled 2014-09-21: qty 1

## 2014-09-21 MED ORDER — HYDROCORTISONE ACE-PRAMOXINE 2.5-1 % RE CREA
TOPICAL_CREAM | Freq: Four times a day (QID) | RECTAL | Status: DC
  Filled 2014-09-21: qty 30

## 2014-09-21 NOTE — Lactation Note (Signed)
This note was copied from the chart of Cheyenne Horton FinerKathryn Eckrich. Lactation Consultation Note Baby had 8% weight loss. Was transferred to NICU after delivery for respiratory distress. [facility] Baby doing well. BF good. Mom denies painful latches. States has good colostrum flow using DEBP to stimulate breast and giving colostrum to baby. Had 8 stools, 8 voids and 6 emesis. Has been very spitty at 32 hrs. Of age. Mom states its getting much better. Baby was circumcised yesterday and was sleepy for several BF, has resovled and feeding regularly. Mom doing STS. Denies soreness to nipples. Encouraged I&O monitoring and discussed supply and demand. Patient Name: Cheyenne [name]: 09/21/2014 Reason for consult: Follow-up assessment;Infant weight loss   Maternal Data Has patient been taught Hand Expression?: Yes  Feeding Feeding Type: Bottle Fed - Formula  LATCH Score/Interventions                      Lactation Tools Discussed/Used     Consult Status Consult Status: Follow-up [name]: 09/22/14 Follow-up type: In-patient    Charyl DancerCARVER, Gurman Ash[place] G 09/21/2014, 7:27 AM

## 2014-09-21 NOTE — Anesthesia Postprocedure Evaluation (Signed)
  Anesthesia Post-op Note  Patient: Cheyenne [name]  Procedure(s) Performed: * No procedures listed *  Patient Location: Mother/Baby  Anesthesia Type:Epidural  Level of Consciousness: awake, alert , oriented and patient cooperative  Airway and Oxygen Therapy: Patient Spontanous Breathing  Post-op Pain: none  Post-op Assessment: Post-op Vital signs reviewed, Patient's Cardiovascular Status Stable, Respiratory Function Stable, Patent Airway, No headache, No backache, No residual numbness and No residual motor weakness  Post-op Vital Signs: Reviewed and stable  Last Vitals:  Filed Vitals:   09/21/14 0635  BP: 104/59  Pulse: 72  Temp: 36.7 C  Resp: 17    Complications: No apparent anesthesia complications

## 2014-09-21 NOTE — Discharge Summary (Signed)
Obstetric Discharge Summary  Reason for Admission: induction of labor - recurrent pregnancy loss with thrombophilia etiology Prenatal Procedures: NST, ultrasound and Lovenox antepartum Intrapartum Procedures: spontaneous vaginal delivery and epidural Postpartum Procedures: none Complications-Operative and Postpartum: 3rd degree perineal laceration with inflamed hemorrhoids HEMOGLOBIN  Date Value Ref Range Status  09/20/2014 8.0* 12.0 - 15.0 g/dL Final   HCT  Date Value Ref Range Status  09/20/2014 24.9* 36.0 - 46.0 % Final    Physical Exam:  General: alert, cooperative and no distress Lochia: appropriate Uterine Fundus: firm Incision: healing well DVT Evaluation: No evidence of DVT seen on physical exam.  Discharge Diagnoses: Term Pregnancy-delivered and IDA of pregnancy and hemorrhoids and 3rd perineal LAC repair  Discharge Information: Date: 09/21/2014 Activity: pelvic rest Diet: routine WITH LOTS OF WATER Medications: PNV, Ibuprofen, Colace, Iron, Vicodin and analpram HC 2.5% Condition: stable Instructions: refer to practice specific booklet Discharge to: home Follow-up Information    Follow up with Lenoard AdenAAVON,RICHARD J, MD. Schedule an appointment as soon as possible for a visit in 6 weeks.   Specialty:  Obstetrics and Gynecology   Contact information:   [address] [place] [address] [phone]       Newborn Data: Live born female  Birth Weight: 9 lb 5 oz (4225 g) APGAR: 8, 9  Home with mother.  Marlinda MikeBAILEY, Primo Innis 09/21/2014, 9:01 AM

## 2014-09-21 NOTE — Progress Notes (Signed)
PPD 2 SVD  S:  Reports feeling tired and "just here" - no sleep since admit             Tolerating po/ No nausea or vomiting             Bleeding is light             Pain controlled with motrin and percocet             Up ad lib / ambulatory / voiding QS  Newborn breast & bottle feeding with some formula supplement / Circumcision done  O:               VS: BP 104/59 mmHg  Pulse 72  Temp(Src) 98 F (36.7 C) (Oral)  Resp 17  Ht 5\' 7"  (1.702 m)  Wt 93.441 kg (206 lb)  BMI 32.26 kg/m2  SpO2 100%  Breastfeeding? Unknown   LABS:              Recent Labs  09/18/14 1725 09/20/14 0605  WBC 12.0* 14.9*  HGB 9.7* 8.0*  PLT 215 185               Blood type: --/--/A POS (05/30 1725)  Rubella: 1.22 (05/30 1849)   Immune                         Physical Exam:             Alert and oriented X3  Lungs: Clear and unlabored  Heart: regular rate and rhythm / no mumurs  Abdomen: soft, non-tender, non-distended              Fundus: firm, non-tender, U-1  Perineum: no edema / + inflamed hemorrhoids  Lochia: light  Extremities: trace edema, no calf pain or tenderness    A: PPD # 2              IDA of pregnancy - compounded by blood loss             RPL with thrombophilia hx - no specific genetic mutation identified (Lovenox ante only)  Doing well - stable status  P: Routine post partum orders  DC home             No anti-coagulant PP per Dr Billy Coastaavon             Add Magnesium 400mg  for constipation prevention and hemorrhoidal cream for comfort  Marlinda MikeBAILEY, Skyeler Scalese CNM, MSN, [facility] 09/21/2014, 8:46 AM

## 2014-09-21 NOTE — Lactation Note (Signed)
This note was copied from the chart of Cheyenne Horton FinerKathryn Vicuna. Lactation Consultation Note: Mother denies questions or concerns about breastfeeding. Mother states that infant is feeding much better. Mother advised in treatment plan to prevent severe engorgement. Mother advised to continue breastfeed infant 8-12 times in 24 hours. Mother was offered follow up with LC . She states she will phone if needed.   Patient Name: Cheyenne [name] ZOXWR'UToday's Date: 09/21/2014     Maternal Data    Feeding    LATCH Score/Interventions                      Lactation Tools Discussed/Used     Consult Status      Cheyenne [name], Cheyenne [name] 09/21/2014, 3:03 PM

## 2014-11-29 ENCOUNTER — Encounter: Payer: Self-pay | Admitting: Medical

## 2014-11-29 ENCOUNTER — Ambulatory Visit (INDEPENDENT_AMBULATORY_CARE_PROVIDER_SITE_OTHER): Payer: PRIVATE HEALTH INSURANCE | Admitting: Medical

## 2014-11-29 VITALS — BP 98/58 | HR 100 | Wt 178.8 lb

## 2014-11-29 DIAGNOSIS — F902 Attention-deficit hyperactivity disorder, combined type: Secondary | ICD-10-CM | POA: Insufficient documentation

## 2014-11-29 DIAGNOSIS — D6489 Other specified anemias: Secondary | ICD-10-CM | POA: Insufficient documentation

## 2014-11-29 DIAGNOSIS — I7300 Raynaud's syndrome without gangrene: Secondary | ICD-10-CM | POA: Diagnosis not present

## 2014-11-29 LAB — CBC
HCT: 35.3 % — ABNORMAL LOW (ref 36.0–46.0)
HEMOGLOBIN: 11.7 g/dL — AB (ref 12.0–15.0)
MCH: 26.6 pg (ref 26.0–34.0)
MCHC: 33.1 g/dL (ref 30.0–36.0)
MCV: 80.2 fL (ref 78.0–100.0)
MPV: 9.8 fL (ref 8.6–12.4)
Platelets: 294 10*3/uL (ref 150–400)
RBC: 4.4 MIL/uL (ref 3.87–5.11)
RDW: 14.4 % (ref 11.5–15.5)
WBC: 7.3 10*3/uL (ref 4.0–10.5)

## 2014-11-29 MED ORDER — AMPHETAMINE-DEXTROAMPHETAMINE 15 MG PO TABS: 15.0000 mg | ORAL_TABLET | Freq: Two times a day (BID) | ORAL | Status: DC

## 2014-11-29 MED ORDER — NIFEDIPINE ER OSMOTIC RELEASE 30 MG PO TB24: 30.0000 mg | ORAL_TABLET | Freq: Every day | ORAL | Status: DC

## 2014-11-29 NOTE — Progress Notes (Signed)
Subjective: Here for consult on medication  Since last visit here, she is excited that she had a baby boy Cheyenne [name] born about 2 months ago.  She is currently not breastfeeding.  She was only about to produce breast milk about 2 weeks.   Baby is bottle feeding, doing well after initial breathing issues in NICU.  She would like to restart medications.  Prior to getting pregnant a year ago, was doing fine on medications for ADHD and Raynaud's.  During pregnancy she certainly struggled with focus, attention, completing tasks, and hard to stay on track day to day at work.  In general Raynaud's is worse during winter and the adderall also aggravates the Raynaud's, but when on the Nifedipine, the combination seems to work fine.   Ready to go back on the medication.       No other c/o.  The following portions of the patient's history were reviewed and updated as appropriate: allergies, current medications, past family history, past medical history, past social history, past surgical history and problem list.  Past Medical History  Diagnosis Date  . Headache(784.0)   . Anxiety   . ADHD (attention deficit hyperactivity disorder)   . IBS (irritable bowel syndrome)     lactose intol  . Complication of anesthesia 93    93hallucinations with anesthesia, 2001-slow to awaken  . PONV (postoperative nausea and vomiting)   . Wears glasses   . Polyarthralgia     knees, hips, fingers bilat  . Female infertility   . H/O bilateral salpingectomy     ovaries and uterus still present  . Insomnia   . Raynaud phenomenon     Dr. Kellie Simmering, rheumatology consult 08/2011; +ANA  . ADHD (attention deficit hyperactivity disorder)     No Known Allergies   Review of Systems ROS reviewed and was negative other than noted in HPI or above.    Objective:   Physical Exam BP 98/58 mmHg  Pulse 100  Wt 178 lb 12.8 oz (81.103 kg)  . BP Readings from Last 3 Encounters:  11/29/14 98/58  09/21/14 104/59  08/22/13 108/62    Wt Readings from Last 3 Encounters:  11/29/14 178 lb 12.8 oz (81.103 kg)  09/18/14 206 lb (93.441 kg)  08/22/13 149 lb (67.586 kg)   General appearance: alert, no distress, WD/WN Neck: supple, no lymphadenopathy, no thyromegaly, no masses Heart: RRR, normal S1, S2, no murmurs Lungs: CTA bilaterally, no wheezes, rhonchi, or rales Pulses: 2+ symmetric, upper and lower extremities, normal cap refill Psych: pleasant, good eye contact, answers questions appropriately    Assessment: Encounter Diagnoses  Name Primary?  . ADHD (attention deficit hyperactivity disorder), combined type Yes  . Raynaud phenomenon   . Anemia due to other cause      Plan: ADHD-restart Adderall  twice daily, discussed focus, job stress.   Recheck in 2-3 mo  Raynauds - discussed prior treatments, supportive care, preventative measures, restart Nifedipine XR daily.   Anemia - noted on labs just after delivery.   Recheck CBC  Cheyenne [name] was seen today for other.  Diagnoses and all orders for this visit:  ADHD (attention deficit hyperactivity disorder), combined type  Raynaud phenomenon  Anemia due to other cause -     CBC  Other orders -     NIFEdipine (PROCARDIA-XL/ADALAT-CC/NIFEDICAL-XL) 30 MG 24 hr tablet; Take 1 tablet (30 mg total) by mouth daily. -     Discontinue: amphetamine-dextroamphetamine (ADDERALL) 15 MG tablet; Take 1 tablet by mouth 2 (two) times  daily. -     Discontinue: amphetamine-dextroamphetamine (ADDERALL) 15 MG tablet; Take 1 tablet by mouth 2 (two) times daily. -     amphetamine-dextroamphetamine (ADDERALL) 15 MG tablet; Take 1 tablet by mouth 2 (two) times daily.

## 2015-07-06 ENCOUNTER — Ambulatory Visit (INDEPENDENT_AMBULATORY_CARE_PROVIDER_SITE_OTHER): Payer: Managed Care, Other (non HMO) | Admitting: Medical

## 2015-07-06 VITALS — BP 104/70 | HR 74 | Wt 169.0 lb

## 2015-07-06 DIAGNOSIS — F902 Attention-deficit hyperactivity disorder, combined type: Secondary | ICD-10-CM | POA: Diagnosis not present

## 2015-07-06 DIAGNOSIS — J309 Allergic rhinitis, unspecified: Secondary | ICD-10-CM | POA: Insufficient documentation

## 2015-07-06 DIAGNOSIS — I7300 Raynaud's syndrome without gangrene: Secondary | ICD-10-CM

## 2015-07-06 MED ORDER — AMPHETAMINE-DEXTROAMPHET ER 15 MG PO CP24: 15.0000 mg | ORAL_CAPSULE | ORAL | Status: DC

## 2015-07-06 MED ORDER — NIFEDIPINE ER OSMOTIC RELEASE 30 MG PO TB24: 30.0000 mg | ORAL_TABLET | Freq: Every day | ORAL | Status: DC

## 2015-07-06 NOTE — Progress Notes (Signed)
Subjective: Chief Complaint  Patient presents with  . med check    states all is going good, pt needs refills. wants to know about getting back on ER   Here for med check.   Things are going fine, but wants to go back to XR adderall. Often she forgets to take the afternoon dose but needs it.  She forgets about it as she can't leave the medication just hanging out for fear that a client may take a pill left out.  She hides them or keeps them in safe keeping at work but forgets about them.    Raynaud's still seems to be controlled on current medication.   Her son Remi Deter is 64mo old now, on baby food now.  He is big, on top end of percentiles for growth and doing well.   Work is doing fine, still practicing in the same role.   Is having some allergy problems, using OTC Claritin.  No other c/o.  The following portions of the patient's history were reviewed and updated as appropriate: allergies, current medications, past family history, past medical history, past social history, past surgical history and problem list.  Past Medical History  Diagnosis Date  . Headache(784.0)   . Anxiety   . ADHD (attention deficit hyperactivity disorder)   . IBS (irritable bowel syndrome)     lactose intol  . Complication of anesthesia 93    93hallucinations with anesthesia, 2001-slow to awaken  . PONV (postoperative nausea and vomiting)   . Wears glasses   . Polyarthralgia     knees, hips, fingers bilat  . Female infertility   . H/O bilateral salpingectomy     ovaries and uterus still present  . Insomnia   . Raynaud phenomenon     Dr. Kellie Simmering, rheumatology consult 08/2011; +ANA  . ADHD (attention deficit hyperactivity disorder)     No Known Allergies   Review of Systems ROS reviewed and was negative other than noted in HPI or above.    Objective:   Physical Exam BP 104/70 mmHg  Pulse 74  Wt 169 lb (76.658 kg)  LMP 06/22/2015 (Exact Date)  Breastfeeding? No  . BP Readings from Last 3  Encounters:  07/06/15 104/70  11/29/14 98/58  09/21/14 104/59   Wt Readings from Last 3 Encounters:  07/06/15 169 lb (76.658 kg)  11/29/14 178 lb 12.8 oz (81.103 kg)  09/18/14 206 lb (93.441 kg)   General appearance: alert, no distress, WD/WN Neck: supple, no lymphadenopathy, no thyromegaly, no masses Heart: RRR, normal S1, S2, no murmurs Lungs: CTA bilaterally, no wheezes, rhonchi, or rales Pulses: 2+ symmetric, upper and lower extremities, normal cap refill Psych: pleasant, good eye contact, answers questions appropriately HENT - mild nasal turbinate edema, otherwise ENT unremarkable    Assessment: Encounter Diagnoses  Name Primary?  . ADHD (attention deficit hyperactivity disorder), combined type Yes  . Raynaud phenomenon   . Allergic rhinitis, unspecified allergic rhinitis type      Plan: ADHD-change to Adderall  XR once daily as she forgets afternoon dose but mg strength seems to be ok.   Recheck in 3 mo  Raynaud's - discussed supportive care, preventative measures, c/t Nifedipine XR daily.   Allergic rhinitis - doing fine on Claritin daily OTC  F/u in 37mo for fasting physical, get me copy of tdap and vaccine records.  She likely had tdap last year with [facility]  Kerria was seen today for med check.  Diagnoses and all orders for this visit:  ADHD (attention deficit hyperactivity disorder), combined type  Raynaud phenomenon  Allergic rhinitis, unspecified allergic rhinitis type  Other orders -     Discontinue: amphetamine-dextroamphetamine (ADDERALL XR) 15 MG 24 hr capsule; Take 1 capsule by mouth every morning. -     Discontinue: amphetamine-dextroamphetamine (ADDERALL XR) 15 MG 24 hr capsule; Take 1 capsule by mouth every morning. -     amphetamine-dextroamphetamine (ADDERALL XR) 15 MG 24 hr capsule; Take 1 capsule by mouth every morning. -     NIFEdipine (PROCARDIA-XL/ADALAT-CC/NIFEDICAL-XL) 30 MG 24 hr tablet; Take 1 tablet (30 mg total) by  mouth daily.

## 2015-10-05 ENCOUNTER — Encounter: Payer: Self-pay | Admitting: Medical

## 2015-10-05 ENCOUNTER — Ambulatory Visit (INDEPENDENT_AMBULATORY_CARE_PROVIDER_SITE_OTHER): Payer: Managed Care, Other (non HMO) | Admitting: Medical

## 2015-10-05 VITALS — BP 100/70 | HR 83 | Ht 67.5 in | Wt 162.0 lb

## 2015-10-05 DIAGNOSIS — J309 Allergic rhinitis, unspecified: Secondary | ICD-10-CM | POA: Diagnosis not present

## 2015-10-05 DIAGNOSIS — M25551 Pain in right hip: Secondary | ICD-10-CM

## 2015-10-05 DIAGNOSIS — D6489 Other specified anemias: Secondary | ICD-10-CM | POA: Diagnosis not present

## 2015-10-05 DIAGNOSIS — M25552 Pain in left hip: Secondary | ICD-10-CM

## 2015-10-05 DIAGNOSIS — M21612 Bunion of left foot: Secondary | ICD-10-CM

## 2015-10-05 DIAGNOSIS — F902 Attention-deficit hyperactivity disorder, combined type: Secondary | ICD-10-CM

## 2015-10-05 DIAGNOSIS — Z0000 Encounter for general adult medical examination without abnormal findings: Secondary | ICD-10-CM | POA: Diagnosis not present

## 2015-10-05 DIAGNOSIS — I7300 Raynaud's syndrome without gangrene: Secondary | ICD-10-CM | POA: Diagnosis not present

## 2015-10-05 LAB — LIPID PANEL
CHOL/HDL RATIO: 2.3 ratio (ref <=5.0)
CHOLESTEROL: 166 mg/dL (ref 125–200)
HDL: 71 mg/dL (ref >=46)
LDL Cholesterol: 86 mg/dL (ref <130)
Triglycerides: 43 mg/dL (ref <150)
VLDL: 9 mg/dL (ref <30)

## 2015-10-05 LAB — COMPREHENSIVE METABOLIC PANEL
ALK PHOS: 55 U/L (ref 33–115)
ALT: 15 U/L (ref 6–29)
AST: 17 U/L (ref 10–30)
Albumin: 4.5 g/dL (ref 3.6–5.1)
BUN: 13 mg/dL (ref 7–25)
CALCIUM: 9.2 mg/dL (ref 8.6–10.2)
CO2: 25 mmol/L (ref 20–31)
Chloride: 103 mmol/L (ref 98–110)
Creat: 0.7 mg/dL (ref 0.50–1.10)
Glucose, Bld: 88 mg/dL (ref 65–99)
POTASSIUM: 4.2 mmol/L (ref 3.5–5.3)
Sodium: 138 mmol/L (ref 135–146)
TOTAL PROTEIN: 7.2 g/dL (ref 6.1–8.1)
Total Bilirubin: 0.7 mg/dL (ref 0.2–1.2)

## 2015-10-05 LAB — CBC WITH DIFFERENTIAL/PLATELET
BASOS PCT: 1 %
Basophils Absolute: 60 cells/uL (ref 0–200)
Eosinophils Absolute: 180 cells/uL (ref 15–500)
Eosinophils Relative: 3 %
HEMATOCRIT: 42.4 % (ref 35.0–45.0)
HEMOGLOBIN: 14.3 g/dL (ref 11.7–15.5)
LYMPHS ABS: 2160 {cells}/uL (ref 850–3900)
Lymphocytes Relative: 36 %
MCH: 28.7 pg (ref 27.0–33.0)
MCHC: 33.7 g/dL (ref 32.0–36.0)
MCV: 85.1 fL (ref 80.0–100.0)
MONO ABS: 480 {cells}/uL (ref 200–950)
MPV: 10.4 fL (ref 7.5–12.5)
Monocytes Relative: 8 %
NEUTROS ABS: 3120 {cells}/uL (ref 1500–7800)
NEUTROS PCT: 52 %
Platelets: 239 10*3/uL (ref 140–400)
RBC: 4.98 MIL/uL (ref 3.80–5.10)
RDW: 13.5 % (ref 11.0–15.0)
WBC: 6 10*3/uL (ref 4.0–10.5)

## 2015-10-05 LAB — TSH: TSH: 3.05 mIU/L

## 2015-10-05 MED ORDER — AMPHETAMINE-DEXTROAMPHET ER 15 MG PO CP24: 15.0000 mg | ORAL_CAPSULE | ORAL | Status: DC
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
## 2015-12-03 ENCOUNTER — Encounter: Payer: Self-pay | Admitting: Medical

## 2015-12-03 ENCOUNTER — Ambulatory Visit (INDEPENDENT_AMBULATORY_CARE_PROVIDER_SITE_OTHER): Payer: Managed Care, Other (non HMO) | Admitting: Medical

## 2015-12-03 VITALS — BP 96/60 | HR 95 | Temp 99.1°F | Wt 158.0 lb

## 2015-12-03 DIAGNOSIS — J988 Other specified respiratory disorders: Secondary | ICD-10-CM

## 2015-12-03 MED ORDER — AMOXICILLIN 500 MG PO TABS: ORAL_TABLET | ORAL | 0 refills | Status: DC

## 2015-12-03 NOTE — Progress Notes (Signed)
Subjective:  Cheyenne [name] is a 35 y.o. female who presents for illness.  Chief Complaint  Patient presents with  . Cough    congestion, sore throat, and not feeling good. no fever. achy. took nyquil. started thursday   Here with son who is also sick. Son saw his pediatrician yesterday, diagnosed with cold and ear infection.  Son is on antibiotics.  Son had symptoms initially, then gave it to her husband and now she has it too.  She notes symptoms x 5 days.   She notes sore throat, head congestion, ear pain, cough, some heaviness in chest, achy in general.  No fever.   Has taken some Nyquil.  Using nothing else for symptoms.  Water intake is not great but decent.  No other aggravating or relieving factors.  No other c/o.  The following portions of the patient's history were reviewed and updated as appropriate: allergies, current medications, past family history, past medical history, past social history, past surgical history and problem list.  ROS as in subjective  Past Medical History:  Diagnosis Date  . ADHD (attention deficit hyperactivity disorder)   . ADHD (attention deficit hyperactivity disorder)   . Anxiety   . Complication of anesthesia 93   93hallucinations with anesthesia, 2001-slow to awaken  . Female infertility   . H/O bilateral salpingectomy    ovaries and uterus still present  . Headache(784.0)   . Hydrosalpinx 2012   bilat  . IBS (irritable bowel syndrome)    lactose intol  . Insomnia   . Polyarthralgia    knees, hips, fingers bilat  . PONV (postoperative nausea and vomiting)   . Raynaud phenomenon    Dr. Kellie Simmeringruslow, rheumatology consult 08/2011; +ANA  . Wears glasses    Current Outpatient Prescriptions on File Prior to Visit  Medication Sig Dispense Refill  . amphetamine-dextroamphetamine (ADDERALL XR) 15 MG 24 hr capsule Take 1 capsule by mouth every morning. 30 capsule 0  . NIFEdipine (PROCARDIA-XL/ADALAT-CC/NIFEDICAL-XL) 30 MG 24 hr tablet Take 1 tablet  (30 mg total) by mouth daily. 90 tablet 3   No current facility-administered medications on file prior to visit.    ROS as in subjective   Objective: BP 96/60   Pulse 95   Temp 99.1 F (37.3 C) (Tympanic)   Wt 158 lb (71.7 kg)   LMP 11/02/2015   BMI 24.38 kg/m   BP Readings from Last 3 Encounters:  12/03/15 96/60  10/05/15 100/70  07/06/15 104/70    General appearance: Alert, WD/WN, no distress, ill appearing                             Skin: warm, no rash, no diaphoresis                           Head: mild frontal sinus tenderness                            Eyes: conjunctiva normal, corneas clear, PERRLA                            Ears: flat TMs, mild erythema right TM, otherwise external ear canals normal                          Nose: septum  midline, turbinates swollen, with erythema and clear discharge             Mouth/throat: MMM, tongue normal, mild pharyngeal erythema                           Neck: supple, no adenopathy, no thyromegaly, nontender                          Heart: RRR, normal S1, S2, no murmurs                         Lungs: +bronchial breath sounds, no rhonchi, no wheezes, no rales                Extremities: no edema, nontender     Assessment: Encounter Diagnosis  Name Primary?  Marland Kitchen. Respiratory tract infection Yes     Plan:  Medication orders today include: Amoxicillin.  Discussed diagnosis, supportive care, treatment recommendations.  Suggested symptomatic OTC remedies for cough and congestion.  Can c/t Dayquil/Nyquil.  Tylenol or Ibuprofen OTC for fever and malaise.  Call/return in 2-3 days if symptoms are worse or not improving.      Cheyenne [name] was seen today for cough.  Diagnoses and all orders for this visit:  Respiratory tract infection  Other orders -     amoxicillin (AMOXIL) 500 MG tablet; 2 tablets po BID x 10 days

## 2016-01-18 ENCOUNTER — Institutional Professional Consult (permissible substitution): Payer: Managed Care, Other (non HMO) | Admitting: Medical

## 2016-01-25 ENCOUNTER — Encounter: Payer: Self-pay | Admitting: Medical

## 2016-01-25 ENCOUNTER — Ambulatory Visit (INDEPENDENT_AMBULATORY_CARE_PROVIDER_SITE_OTHER): Payer: Managed Care, Other (non HMO) | Admitting: Medical

## 2016-01-25 VITALS — BP 98/64 | HR 74 | Ht 67.5 in | Wt 157.4 lb

## 2016-01-25 DIAGNOSIS — Z2821 Immunization not carried out because of patient refusal: Secondary | ICD-10-CM | POA: Diagnosis not present

## 2016-01-25 DIAGNOSIS — F902 Attention-deficit hyperactivity disorder, combined type: Secondary | ICD-10-CM

## 2016-01-25 DIAGNOSIS — I7300 Raynaud's syndrome without gangrene: Secondary | ICD-10-CM

## 2016-01-25 MED ORDER — AMPHETAMINE-DEXTROAMPHET ER 15 MG PO CP24: 15.0000 mg | ORAL_CAPSULE | ORAL | 0 refills | Status: DC
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
## 2016-07-04 ENCOUNTER — Ambulatory Visit (INDEPENDENT_AMBULATORY_CARE_PROVIDER_SITE_OTHER): Payer: Managed Care, Other (non HMO) | Admitting: Medical

## 2016-07-04 VITALS — Wt 162.0 lb

## 2016-07-04 DIAGNOSIS — R112 Nausea with vomiting, unspecified: Secondary | ICD-10-CM

## 2016-07-04 DIAGNOSIS — R42 Dizziness and giddiness: Secondary | ICD-10-CM

## 2016-07-04 MED ORDER — ONDANSETRON HCL 4 MG PO TABS: 4.0000 mg | ORAL_TABLET | Freq: Three times a day (TID) | ORAL | 0 refills | Status: DC | PRN

## 2016-07-04 MED ORDER — MECLIZINE HCL 25 MG PO TABS: 25.0000 mg | ORAL_TABLET | Freq: Two times a day (BID) | ORAL | 0 refills | Status: DC

## 2016-07-04 NOTE — Progress Notes (Signed)
Subjective: Chief Complaint  Patient presents with  . Dizziness    lightness, dizziness, nausea    Here for not feeling well.  She has her 62mo old toddler with her today.  She reports having sporadic episodes of dizziness, lightheadedness. Most of the time this happens in the morning.  Was concerned her sugar may be running low.  Usually these episodes are brief.  3 days ago had an episode.  Usually the dizziness lasts for minutes, but Tuesday the episode caused her to be nauseated all day.  Moving head too much or too fast would cause nausea .  Did get sick Tuesday night once and vomited.  Has had one loose stool in recent days.   that night if she lied flat was worse nauseated.     wednesday felt better but not great, went to work.  Had to leave work  She reports having cold symptoms last week.  Using Advil cold and sinus yesterday.  Denies fever, no body aches or chills, no chest pain, no SOB, no paresthesias, no hearing changes, no slurred speech.    Past Medical History:  Diagnosis Date  . ADHD (attention deficit hyperactivity disorder)   . ADHD (attention deficit hyperactivity disorder)   . Anxiety   . Complication of anesthesia 93   93hallucinations with anesthesia, 2001-slow to awaken  . Female infertility   . H/O bilateral salpingectomy    ovaries and uterus still present  . Headache(784.0)   . Hydrosalpinx 2012   bilat  . IBS (irritable bowel syndrome)    lactose intol  . Insomnia   . Polyarthralgia    knees, hips, fingers bilat  . PONV (postoperative nausea and vomiting)   . Raynaud phenomenon    Dr. Kellie Simmering, rheumatology consult 08/2011; +ANA  . Wears glasses    Current Outpatient Prescriptions on File Prior to Visit  Medication Sig Dispense Refill  . amphetamine-dextroamphetamine (ADDERALL XR) 15 MG 24 hr capsule Take 1 capsule by mouth every morning. (Patient not taking: Reported on 07/04/2016) 30 capsule 0  . NIFEdipine (PROCARDIA-XL/ADALAT-CC/NIFEDICAL-XL) 30 MG  24 hr tablet Take 1 tablet (30 mg total) by mouth daily. (Patient not taking: Reported on 07/04/2016) 90 tablet 3   No current facility-administered medications on file prior to visit.    Review of Systems Constitutional: -fever, -chills, -sweats, -unexpected weight change,-fatigue ENT: -runny nose, -ear pain, -sore throat Cardiology:  -chest pain, -palpitations, -edema Respiratory: -cough, -shortness of breath, -wheezing Gastroenterology: -abdominal pain, -nausea, -vomiting, -diarrhea, -constipation  Hematology: -bleeding or bruising problems Musculoskeletal: -arthralgias, -myalgias, -joint swelling, -back pain Ophthalmology: -vision changes Urology: -dysuria, -difficulty urinating, -hematuria, -urinary frequency, -urgency Neurology: -headache, -weakness, -tingling, -numbness     Objective: Wt 162 lb (73.5 kg)   SpO2 99%   BMI 25.00 kg/m   Reviewed orthostatic vitals as well  General appearance: alert, no distress, WD/WN,  HEENT: normocephalic, sclerae anicteric, PERRLA, EOMi, nares patent, no discharge or erythema, pharynx normal Oral cavity: MMM, no lesions Neck: supple, no lymphadenopathy, no thyromegaly, no masses Heart: RRR, normal S1, S2, no murmurs Lungs: CTA bilaterally, no wheezes, rhonchi, or rales Extremities: no edema, no cyanosis, no clubbing Pulses: 2+ symmetric, upper and lower extremities, normal cap refill Neurological: alert, oriented x 3, CN2-12 intact, strength normal upper extremities and lower extremities, sensation normal throughout, DTRs 2+ throughout, no cerebellar signs, gait normal Psychiatric: normal affect, behavior normal, pleasant    Assessment: Encounter Diagnoses  Name Primary?  . Dizziness Yes  . Nausea and vomiting,  intractability of vomiting not specified, unspecified vomiting type   . Vertigo      Plan: Symptoms and exam suggest labyrinthitis vs BPPV.   Advised good hydration, medications below, rest, and no sudden motions.  If  worse, no improvement or new symptoms within next 4-5 days, then call or recheck.  Don't drive if too dizzy.     Cheyenne DeistKathryn was seen today for dizziness.  Diagnoses and all orders for this visit:  Dizziness  Nausea and vomiting, intractability of vomiting not specified, unspecified vomiting type  Vertigo  Other orders -     meclizine (ANTIVERT) 25 MG tablet; Take 1 tablet (25 mg total) by mouth 2 (two) times daily. -     ondansetron (ZOFRAN) 4 MG tablet; Take 1 tablet (4 mg total) by mouth every 8 (eight) hours as needed for nausea or vomiting.

## 2016-08-04 ENCOUNTER — Ambulatory Visit: Payer: Managed Care, Other (non HMO) | Admitting: Medical

## 2016-10-17 ENCOUNTER — Ambulatory Visit (INDEPENDENT_AMBULATORY_CARE_PROVIDER_SITE_OTHER): Payer: 59 | Admitting: Medical

## 2016-10-17 ENCOUNTER — Encounter: Payer: Self-pay | Admitting: Medical

## 2016-10-17 VITALS — BP 122/72 | HR 82 | Ht 67.5 in | Wt 162.6 lb

## 2016-10-17 DIAGNOSIS — W57XXXA Bitten or stung by nonvenomous insect and other nonvenomous arthropods, initial encounter: Secondary | ICD-10-CM | POA: Diagnosis not present

## 2016-10-17 DIAGNOSIS — Z0000 Encounter for general adult medical examination without abnormal findings: Secondary | ICD-10-CM | POA: Insufficient documentation

## 2016-10-17 DIAGNOSIS — R5383 Other fatigue: Secondary | ICD-10-CM | POA: Insufficient documentation

## 2016-10-17 DIAGNOSIS — F902 Attention-deficit hyperactivity disorder, combined type: Secondary | ICD-10-CM | POA: Diagnosis not present

## 2016-10-17 DIAGNOSIS — I7300 Raynaud's syndrome without gangrene: Secondary | ICD-10-CM | POA: Diagnosis not present

## 2016-10-17 DIAGNOSIS — J301 Allergic rhinitis due to pollen: Secondary | ICD-10-CM

## 2016-10-17 LAB — CBC
HCT: 41.9 % (ref 35.0–45.0)
Hemoglobin: 13.7 g/dL (ref 11.7–15.5)
MCH: 28.7 pg (ref 27.0–33.0)
MCHC: 32.7 g/dL (ref 32.0–36.0)
MCV: 87.8 fL (ref 80.0–100.0)
MPV: 10.1 fL (ref 7.5–12.5)
PLATELETS: 245 10*3/uL (ref 140–400)
RBC: 4.77 MIL/uL (ref 3.80–5.10)
RDW: 14.1 % (ref 11.0–15.0)
WBC: 4.8 10*3/uL (ref 4.0–10.5)

## 2016-10-17 LAB — COMPREHENSIVE METABOLIC PANEL
ALBUMIN: 4.5 g/dL (ref 3.6–5.1)
ALT: 13 U/L (ref 6–29)
AST: 17 U/L (ref 10–30)
Alkaline Phosphatase: 53 U/L (ref 33–115)
BILIRUBIN TOTAL: 0.3 mg/dL (ref 0.2–1.2)
BUN: 15 mg/dL (ref 7–25)
CO2: 24 mmol/L (ref 20–31)
CREATININE: 0.72 mg/dL (ref 0.50–1.10)
Calcium: 8.9 mg/dL (ref 8.6–10.2)
Chloride: 106 mmol/L (ref 98–110)
Glucose, Bld: 92 mg/dL (ref 65–99)
Potassium: 4.2 mmol/L (ref 3.5–5.3)
SODIUM: 141 mmol/L (ref 135–146)
TOTAL PROTEIN: 6.9 g/dL (ref 6.1–8.1)

## 2016-10-17 LAB — POCT URINALYSIS DIP (PROADVANTAGE DEVICE)
Bilirubin, UA: NEGATIVE
Glucose, UA: NEGATIVE mg/dL
Ketones, POC UA: NEGATIVE mg/dL
LEUKOCYTES UA: NEGATIVE
NITRITE UA: NEGATIVE
PH UA: 7.5 (ref 5.0–8.0)
PROTEIN UA: NEGATIVE mg/dL
RBC UA: NEGATIVE
Specific Gravity, Urine: 1.02
Urobilinogen, Ur: NEGATIVE

## 2016-10-17 LAB — TSH: TSH: 2.59 m[IU]/L

## 2016-10-17 MED ORDER — AMPHETAMINE-DEXTROAMPHET ER 15 MG PO CP24: 15.0000 mg | ORAL_CAPSULE | ORAL | 0 refills | Status: DC

## 2016-10-17 MED ORDER — DOXYCYCLINE HYCLATE 100 MG PO TABS: 100.0000 mg | ORAL_TABLET | Freq: Two times a day (BID) | ORAL | 0 refills | Status: DC

## 2016-10-17 NOTE — Patient Instructions (Signed)
Recommendations See your gynecologist yearly for routine gynecological care. See your dentist yearly for routine dental care including hygiene visits twice yearly.  Begin Doxycycline antibiotic for possible tick born illness  Set a reminder on your phone to take your Adderall daily  Get 1200mg  of calcium and 400-600 Vit D daily as a supplement  Exercise regularly with weight bearing and aerobic exercise  Get your flu shot yearly in the fall    Preventing Osteoporosis, Adult Osteoporosis is a condition that causes the bones to get weaker. With osteoporosis, the bones become thinner, and the normal spaces in bone tissue become larger. This can make the bones weak and cause them to break more easily. People who have osteoporosis are more likely to break their wrist, spine, or hip. Even a minor accident or injury can be enough to break weak bones. Osteoporosis can occur with aging. Your body constantly replaces old bone tissue with new tissue. As you get older, you may lose bone tissue more quickly, or it may be replaced more slowly. Osteoporosis is more likely to develop if you have poor nutrition or do not get enough calcium or vitamin D. Other lifestyle factors can also play a role. By making some diet and lifestyle changes, you can help to keep your bones healthy and help to prevent osteoporosis. What nutrition changes can be made? Nutrition plays an important role in maintaining healthy, strong bones.  Make sure you get enough calcium every day from food or from calcium supplements. ? If you are age 34 or younger, aim to get 1,000 mg of calcium every day. ? If you are older than age 34, aim to get 1,200 mg of calcium every day.  Try to get enough vitamin D every day. ? If you are age 34 or younger, aim to get 600 international units (IU) every day. ? If you are older than age 370, aim to get 800 international units every day.  Follow a healthy diet. Eat plenty of foods that contain  calcium and vitamin D. ? Calcium is in milk, cheese, yogurt, and other dairy products. Some fish and vegetables are also good sources of calcium. Many foods such as cereals and breads have had calcium added to them (are fortified). Check nutrition labels to see how much calcium is in a food or drink. ? Foods that contain vitamin D include milk, cereals, salmon, and tuna. Your body also makes vitamin D when you are out in the sun. Bare skin exposure to the sun on your face, arms, legs, or back for no more than 30 minutes a day, 2 times per week is more than enough. Beyond that, it is important to use sunblock to protect your skin from sunburn, which increases your risk for skin cancer.  What lifestyle changes can be made? Making changes in your everyday life can also play an important role in preventing osteoporosis.  Stay active and get exercise every day. Ask your health care provider what types of exercise are best for you.  Do not use any products that contain nicotine or tobacco, such as cigarettes and e-cigarettes. If you need help quitting, ask your health care provider.  Limit alcohol intake to no more than 1 drink a day for nonpregnant women and 2 drinks a day for men. One drink equals 12 oz of beer, 5 oz of wine, or 1 oz of hard liquor.  Why are these changes important? Making these nutrition and lifestyle changes can:  Help you develop  and maintain healthy, strong bones.  Prevent loss of bone mass and the problems that are caused by that loss, such as broken bones and delayed healing.  Make you feel better mentally and physically.  What can happen if changes are not made? Problems that can result from osteoporosis can be very serious. These may include:  A higher risk of broken bones that are painful and do not heal well.  Physical malformations, such as a collapsed spine or a hunched back.  Problems with movement.  Where to find support: If you need help making changes to  prevent osteoporosis, talk with your health care provider. You can ask for a referral to a diet and nutrition specialist (dietitian) and a physical therapist. Where to find more information: Learn more about osteoporosis from:  NIH Osteoporosis and Related Bone Diseases National Resource Center: www.niams.[employer]usBr[employer].ch  U.S. Office on Women's Health: SodaWaters.hu.html  National Osteoporosis Foundation: NetsBook.it  Summary  Osteoporosis is a condition that causes weak bones that are more likely to break.  Eating a healthy diet and making sure you get enough calcium and vitamin D can help prevent osteoporosis.  Other ways to reduce your risk of osteoporosis include getting regular exercise and avoiding alcohol and products that contain nicotine or tobacco. This information is not intended to replace advice given to you by your health care provider. Make sure you discuss any questions you have with your health care provider. Document Released: 04/22/2015 Document Revised: 12/17/2015 Document Reviewed: 12/17/2015 Elsevier Interactive Patient Education  [employer].

## 2016-10-17 NOTE — Progress Notes (Signed)
Subjective:   HPI  Cheyenne [name] is a 36 y.o. female who presents for a complete physical.  Medical care team includes:  Dr. Billy Coast, OB/Gyn  dentist Jonea Bukowski, Kermit Balo, PA-C here for primary care  Concerns: Has place on left side of torso that had bite mark and now has large ring around it.   Has been tired, muscle and joint aches, fatigue for the pat 2 weeks.  Thinks she had a tick bite but never saw a tick.    Reviewed their medical, surgical, family, social, medication, and allergy history and updated chart as appropriate.  Past Medical History:  Diagnosis Date  . ADHD (attention deficit hyperactivity disorder)   . Anxiety   . Complication of anesthesia 93   93hallucinations with anesthesia, 2001-slow to awaken  . Female infertility   . H/O bilateral salpingectomy    ovaries and uterus still present  . Headache(784.0)   . Hydrosalpinx 2012   bilat  . IBS (irritable bowel syndrome)    lactose intol  . Insomnia   . Polyarthralgia    knees, hips, fingers bilat  . PONV (postoperative nausea and vomiting)   . Raynaud phenomenon    Dr. Kellie Simmering, rheumatology consult 08/2011; +ANA  . Wears glasses     Past Surgical History:  Procedure Laterality Date  . APPENDECTOMY  93  . BILATERAL SALPINGECTOMY  2012  . CHOLECYSTECTOMY  2003   ponv  . DIAGNOSTIC LAPAROSCOPY     2 separate surgeries, complications with hydrosalpinx, anomalous positioned appendix  . ESOPHAGOGASTRODUODENOSCOPY  2006  . INCISE AND DRAIN ABCESS  93   hallucinations with anesthesia    Social History   Social History  . Marital status: Married    Spouse name: N/A  . Number of children: N/A  . Years of education: N/A   Occupational History  . attorney, assistant DA [employer]    [place]   Social History Main Topics  . Smoking status: Former Smoker    Years: 10.00    Types: Cigarettes    Quit date: 12/22/2007  . Smokeless tobacco: Never Used  . Alcohol use Yes      Comment: rarely  . Drug use: No  . Sexual activity: Not on file   Other Topics Concern  . Not on file   Social History Narrative   Married, son Tora Duck, has a dog, christian, hx/o infertility problems, exercises -stairs, walking.  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   09/2016    Family History  Problem Relation Age of Onset  . Diabetes Father   . Heart disease Maternal Uncle   . Cancer Maternal Grandmother        skin  . Stroke Maternal Grandmother   . Diabetes Paternal Grandfather   . Cancer Paternal Grandfather        skin  . Fibromyalgia Mother   . Hypertension Mother   . Barrett's esophagus Mother   . Depression Mother   . Hyperlipidemia Mother   . Heart disease Other        neice with congential heart defect     Current Outpatient Prescriptions:  .  [START ON 12/17/2016] amphetamine-dextroamphetamine (ADDERALL XR) 15 MG 24 hr capsule, Take 1 capsule by mouth every morning., Disp: 30 capsule, Rfl: 0 .  doxycycline (VIBRA-TABS) 100 MG tablet, Take 1 tablet (100 mg total) by mouth 2 (two) times daily., Disp: 20 tablet, Rfl: 0 .  NIFEdipine (PROCARDIA-XL/ADALAT-CC/NIFEDICAL-XL) 30 MG 24 hr tablet, Take 1 tablet (  30 mg total) by mouth daily. (Patient not taking: Reported on 07/04/2016), Disp: 90 tablet, Rfl: 3  No Known Allergies   Review of Systems Constitutional: -fever, -chills, -sweats, -unexpected weight change, -decreased appetite, +fatigue Allergy: -sneezing, -itching, -congestion Dermatology: -changing moles, --rash, -lumps ENT: -runny nose, -ear pain, -sore throat, -hoarseness, -sinus pain, -teeth pain, - ringing in ears, -hearing loss, -nosebleeds Cardiology: -chest pain, -palpitations, -swelling, -difficulty breathing when lying flat, -waking up short of breath Respiratory: -cough, -shortness of breath, -difficulty breathing with exercise or exertion, -wheezing, -coughing up blood Gastroenterology: -abdominal pain, -nausea, -vomiting, -diarrhea, -constipation,  -blood in stool, -changes in bowel movement, -difficulty swallowing or eating Hematology: -bleeding, -bruising  Musculoskeletal: +joint aches, -muscle aches, -joint swelling, -back pain, -neck pain, -cramping, -changes in gait Ophthalmology: denies vision changes, eye redness, itching, discharge Urology: -burning with urination, -difficulty urinating, -blood in urine, -urinary frequency, -urgency, -incontinence Neurology: -headache, -weakness, -tingling, -numbness, -memory loss, -falls, -dizziness Psychology: -depressed mood, -agitation, -sleep problems     Objective:   Physical Exam  BP 122/72   Pulse 82   Ht 5' 7.5" (1.715 m)   Wt 162 lb 9.6 oz (73.8 kg)   LMP 10/13/2016   BMI 25.09 kg/m   General appearance: alert, no distress, WD/WN,white female Skin: left upper lateral torso with 2cm puffy pink lesion with central scab, possible insect bite, no erythema migrans, scattered macules, no worrisome lesions HEENT: normocephalic, conjunctiva/corneas normal, sclerae anicteric, PERRLA, EOMi, nares patent, no discharge or erythema, pharynx normal Oral cavity: MMM, tongue normal, teeth in good repair Neck: supple, no lymphadenopathy, no thyromegaly, no masses, normal ROM,no bruits Chest: non tender, normal shape and expansion Heart: RRR, normal S1, S2, no murmurs Lungs: CTA bilaterally, no wheezes, rhonchi, or rales Abdomen: +bs, soft, several surgical port scars, upper and mid abdomen, non tender, non distended, no masses, no hepatomegaly, no splenomegaly, no bruits Back: non tender, normal ROM, no scoliosis Musculoskeletal: mild bunion with some deviation of left great toe, hips nontender with normal ROM, otherwise upper extremities non tender, no obvious deformity, normal ROM throughout, lower extremities non tender, no obvious deformity, normal ROM throughout Extremities: no edema, no cyanosis, no clubbing Pulses: 2+ symmetric, upper and lower extremities, normal cap  refill Neurological: alert, oriented x 3, CN2-12 intact, strength normal upper extremities and lower extremities, sensation normal throughout, DTRs 2+ throughout, no cerebellar signs, gait normal Psychiatric: normal affect, behavior normal, pleasant  Breast/pelvic/rectal - deferred to gyn    Assessment and Plan :    Encounter Diagnoses  Name Primary?  . Routine general medical examination at a health care facility Yes  . Encounter for health maintenance examination in adult   . ADHD (attention deficit hyperactivity disorder), combined type   . Allergic rhinitis due to pollen, unspecified seasonality   . Raynaud's phenomenon without gangrene   . Insect bite, initial encounter   . Other fatigue    Physical exam - discussed healthy lifestyle, diet, exercise, preventative care, vaccinations, and addressed their concerns.  Handout given. See your eye doctor yearly for routine vision care. See your dentist yearly for routine dental care including hygiene visits twice yearly. See your gynecologist yearly for routine gynecological care. ADHD - c/t same medication, refills x 3 today Fatigue, joint aches, possible tick exposure - begin doxycycline Follow-up pending labs  Cheyenne [name] was seen today for cpe.  Diagnoses and all orders for this visit:  Routine general medical examination at a health care facility -     POCT Urinalysis  DIP (Proadvantage Device) -     CBC -     Comprehensive metabolic panel -     TSH -     VITAMIN D 25 Hydroxy (Vit-D Deficiency, Fractures)  Encounter for health maintenance examination in adult  ADHD (attention deficit hyperactivity disorder), combined type  Allergic rhinitis due to pollen, unspecified seasonality  Raynaud's phenomenon without gangrene  Insect bite, initial encounter  Other fatigue  Other orders -     Discontinue: amphetamine-dextroamphetamine (ADDERALL XR) 15 MG 24 hr capsule; Take 1 capsule by mouth every morning. -     Discontinue:  amphetamine-dextroamphetamine (ADDERALL XR) 15 MG 24 hr capsule; Take 1 capsule by mouth every morning. -     amphetamine-dextroamphetamine (ADDERALL XR) 15 MG 24 hr capsule; Take 1 capsule by mouth every morning. -     doxycycline (VIBRA-TABS) 100 MG tablet; Take 1 tablet (100 mg total) by mouth 2 (two) times daily.

## 2016-10-18 LAB — VITAMIN D 25 HYDROXY (VIT D DEFICIENCY, FRACTURES): VIT D 25 HYDROXY: 29 ng/mL — AB (ref 30–100)

## 2016-10-20 ENCOUNTER — Other Ambulatory Visit: Payer: Self-pay | Admitting: Medical

## 2016-10-20 MED ORDER — VITAMIN D (ERGOCALCIFEROL) 1.25 MG (50000 UNIT) PO CAPS: 50000.0000 [IU] | ORAL_CAPSULE | ORAL | 0 refills | Status: DC

## 2017-02-25 ENCOUNTER — Telehealth: Payer: Self-pay | Admitting: Family Medicine

## 2017-02-25 ENCOUNTER — Other Ambulatory Visit: Payer: Self-pay | Admitting: Medical

## 2017-02-25 MED ORDER — AMPHETAMINE-DEXTROAMPHET ER 15 MG PO CP24: 15.0000 mg | ORAL_CAPSULE | Freq: Every day | ORAL | 0 refills | Status: DC

## 2017-02-25 MED ORDER — AMPHETAMINE-DEXTROAMPHET ER 15 MG PO CP24: 15.0000 mg | ORAL_CAPSULE | ORAL | 0 refills | Status: DC

## 2017-02-25 NOTE — Telephone Encounter (Signed)
rx ready 

## 2017-02-25 NOTE — Telephone Encounter (Signed)
Called pt reached voice mail lmtrc. 

## 2017-02-25 NOTE — Telephone Encounter (Signed)
Patient called for refill request for Adderall XR 15mg .  She is in town this morning until 12.  Please call [phone] when ready.

## 2017-08-06 ENCOUNTER — Telehealth: Payer: Self-pay | Admitting: Medical

## 2017-08-06 MED ORDER — AMPHETAMINE-DEXTROAMPHET ER 15 MG PO CP24: 15.0000 mg | ORAL_CAPSULE | Freq: Every day | ORAL | 0 refills | Status: DC

## 2017-08-06 NOTE — Telephone Encounter (Signed)
Pt needs refill Adderall XR sent to [facility], she has appt scheduled with Vincenza HewsShane 08/21/17 for Med check.   Pt runs out of her med tomorrow.

## 2017-08-06 NOTE — Telephone Encounter (Signed)
Sent for 30

## 2017-08-21 ENCOUNTER — Encounter: Payer: 59 | Admitting: Medical

## 2017-09-03 ENCOUNTER — Ambulatory Visit (INDEPENDENT_AMBULATORY_CARE_PROVIDER_SITE_OTHER): Payer: 59 | Admitting: Medical

## 2017-09-03 ENCOUNTER — Encounter: Payer: Self-pay | Admitting: Medical

## 2017-09-03 VITALS — BP 110/62 | HR 89 | Temp 97.9°F | Ht 67.0 in | Wt 153.8 lb

## 2017-09-03 DIAGNOSIS — E559 Vitamin D deficiency, unspecified: Secondary | ICD-10-CM

## 2017-09-03 DIAGNOSIS — F902 Attention-deficit hyperactivity disorder, combined type: Secondary | ICD-10-CM | POA: Diagnosis not present

## 2017-09-03 DIAGNOSIS — I7300 Raynaud's syndrome without gangrene: Secondary | ICD-10-CM

## 2017-09-03 MED ORDER — AMPHETAMINE-DEXTROAMPHET ER 15 MG PO CP24: 15.0000 mg | ORAL_CAPSULE | Freq: Every day | ORAL | 0 refills | Status: DC

## 2017-09-03 NOTE — Progress Notes (Signed)
Subjective: Chief Complaint  Patient presents with  . Medication Management    wants vitamin d check   Here for med check.  Doing fine on current medication for attention deficit.  Her current employment situation is less stressful than prior.  She feels like she handles her stressors much better now.  Stress certainly can seem to aggravate the Raynauds.  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   Has had some fatigue of late.   Since last visit saw a nutritionist, lost some weight, feels better overall.  Past Medical History:  Diagnosis Date  . ADHD (attention deficit hyperactivity disorder)   . Anxiety   . Complication of anesthesia 93   93hallucinations with anesthesia, 2001-slow to awaken  . Female infertility   . H/O bilateral salpingectomy    ovaries and uterus still present  . Headache(784.0)   . Hydrosalpinx 2012   bilat  . IBS (irritable bowel syndrome)    lactose intol  . Insomnia   . Polyarthralgia    knees, hips, fingers bilat  . PONV (postoperative nausea and vomiting)   . Raynaud phenomenon    Dr. Kellie Simmering, rheumatology consult 08/2011; +ANA  . Wears glasses    Current Outpatient Medications on File Prior to Visit  Medication Sig Dispense Refill  . NIFEdipine (PROCARDIA-XL/ADALAT-CC/NIFEDICAL-XL) 30 MG 24 hr tablet Take 1 tablet (30 mg total) by mouth daily. (Patient not taking: Reported on 07/04/2016) 90 tablet 3  . Vitamin D, Ergocalciferol, (DRISDOL) 50000 units CAPS capsule Take 1 capsule (50,000 Units total) by mouth every 7 (seven) days. (Patient not taking: Reported on 09/03/2017) 12 capsule 0   No current facility-administered medications on file prior to visit.    ROS as in subjective   Objective: BP 110/62   Pulse 89   Temp 97.9 F (36.6 C) (Oral)   Ht  (1.702 m)   Wt 153 lb 12.8 oz (69.8 kg)   LMP 08/29/2017 (Within Days)   SpO2 99%   BMI 24.09 kg/m   General appearance: alert,  no distress, WD/WN,  neck: supple, no lymphadenopathy, no thyromegaly, no masses Heart: RRR, normal S1, S2, no murmurs Lungs: CTA bilaterally, no wheezes, rhonchi, or rales Pulses: 2+ symmetric, upper and lower extremities, normal cap refill Psych: pleasant, good eye contact, answers questions appropriately     Assessment: Encounter Diagnoses  Name Primary?  . ADHD (attention deficit hyperactivity disorder), combined type Yes  . Raynaud's phenomenon without gangrene   . Vitamin D deficiency      Plan: ADHD - doing fine on current regimen  Raynauds - less of a problem of late  Vit D deficiency - lab today.  counseled on Vit D, diet, sun exposure  Jerlean was seen today for medication management.  Diagnoses and all orders for this visit:  ADHD (attention deficit hyperactivity disorder), combined type  Raynaud's phenomenon without gangrene  Vitamin D deficiency -     VITAMIN D 25 Hydroxy (Vit-D Deficiency, Fractures)  Other orders -     amphetamine-dextroamphetamine (ADDERALL XR) 15 MG 24 hr capsule; Take 1 capsule by mouth daily.   F/u soon as planned for psychiatry

## 2017-09-04 LAB — VITAMIN D 25 HYDROXY (VIT D DEFICIENCY, FRACTURES): Vit D, 25-Hydroxy: 30.1 ng/mL (ref 30.0–100.0)

## 2017-11-20 ENCOUNTER — Other Ambulatory Visit: Payer: Self-pay | Admitting: Medical

## 2017-11-20 NOTE — Telephone Encounter (Signed)
Is this ok to refill?  

## 2017-11-20 NOTE — Telephone Encounter (Signed)
Prefer she wait until Vincenza HewsShane is back but if she is out then we will refill for 30 days.

## 2017-11-23 NOTE — Telephone Encounter (Signed)
Ok for 30 days. Her pregnancy status is in question when I tried to refill. Please let here know that this medication is not safe in pregnancy and find out LMP.

## 2017-11-23 NOTE — Telephone Encounter (Signed)
Left message for pt to call me back I will also send a mychart message as well

## 2017-11-23 NOTE — Telephone Encounter (Signed)
Pt is out of med. Can you refill this for 30 days

## 2017-11-25 MED ORDER — AMPHETAMINE-DEXTROAMPHET ER 15 MG PO CP24: 15.0000 mg | ORAL_CAPSULE | Freq: Every day | ORAL | 0 refills | Status: DC

## 2017-11-25 NOTE — Telephone Encounter (Signed)
done

## 2017-11-25 NOTE — Telephone Encounter (Signed)
Please refill.

## 2018-01-11 ENCOUNTER — Other Ambulatory Visit: Payer: Self-pay | Admitting: Medical

## 2018-01-11 ENCOUNTER — Telehealth: Payer: Self-pay | Admitting: Medical

## 2018-01-11 MED ORDER — AMPHETAMINE-DEXTROAMPHET ER 15 MG PO CP24: 15.0000 mg | ORAL_CAPSULE | Freq: Every day | ORAL | 0 refills | Status: DC

## 2018-01-11 NOTE — Telephone Encounter (Signed)
Pt called for refills of Adderall. Please send to [facility].

## 2018-02-22 ENCOUNTER — Other Ambulatory Visit: Payer: Self-pay

## 2018-02-22 MED ORDER — AMPHETAMINE-DEXTROAMPHET ER 15 MG PO CP24: 15.0000 mg | ORAL_CAPSULE | Freq: Every day | ORAL | 0 refills | Status: DC

## 2018-02-22 NOTE — Telephone Encounter (Signed)
Is this ok to refill?  

## 2018-03-13 ENCOUNTER — Telehealth: Payer: Self-pay | Admitting: Medical

## 2018-03-13 NOTE — Telephone Encounter (Signed)
P.A. ADDERALL XR  

## 2018-03-22 NOTE — Telephone Encounter (Signed)
P.A. Approved til 03/13/21 °

## 2018-03-23 NOTE — Telephone Encounter (Signed)
Left message for pt

## 2018-09-09 ENCOUNTER — Telehealth: Payer: Self-pay

## 2018-09-09 NOTE — Telephone Encounter (Signed)
Left message on voicemail for patient to call back to schedule CPE  

## 2019-02-05 ENCOUNTER — Other Ambulatory Visit: Payer: Self-pay

## 2019-02-05 DIAGNOSIS — Z20822 Contact with and (suspected) exposure to covid-19: Secondary | ICD-10-CM

## 2019-02-08 LAB — NOVEL CORONAVIRUS, NAA: SARS-CoV-2, NAA: NOT DETECTED

## 2019-02-14 ENCOUNTER — Telehealth: Payer: Self-pay | Admitting: Internal Medicine

## 2019-02-14 NOTE — Telephone Encounter (Signed)
I am sorry to hear she was positive.  Recommend a virtual consult for this.  In general....  I recommend you rest, hydrate well with water and clear fluids throughout the day.   You can use Tylenol for pain or fever You can use over the counter Delsym for cough. You can use over the counter Emetrol for nausea.     If you are having trouble breathing, if you are very weak, have high fever 103 or higher consistently despite Tylenol, or uncontrollable nausea and vomiting, then call or go to the emergency department.    If you have other questions or have other symptoms or questions you are concerned about then please make a virtual visit  Covid symptoms such as fatigue and cough can linger over 2 weeks, even after the initial fever, aches, chills, and other initial symptoms.   Self Quarantine: The CDC, Centers for Disease Control has recommended a self quarantine of 10 days from the start of your illness until you are symptom-free including at least 24 hours of no symptoms including no fever, no shortness of breath, and no body aches and chills, by day 10 before returning to work or general contact with the public.  What does self quarantine mean: avoiding contact with people as much as possible.   Particularly in your house, isolate your self from others in a separate room, wear a mask when possible in the room, particularly if coughing a lot.   Have others bring food, water, medications, etc., to your door, but avoid direct contact with your household contacts during this time to avoid spreading the infection to them.   If you have a separate bathroom and living quarters during the next 2 weeks away from others, that would be preferable.    If you can't completely isolate, then wear a mask, wash hands frequently with soap and water for at least 15 seconds, minimize close contact with others, and have a friend or family member check regularly from a distance to make sure you are not getting  seriously worse.     You should not be going out in public, should not be going to stores, to work or other public places until all your symptoms have resolved and at least 10 days + 24 hours of no symptoms at all have transpired.   Ideally you should avoid contact with others for a full 10 days if possible.  One of the goals is to limit spread to high risk people; people that are older and elderly, people with multiple health issues like diabetes, heart disease, lung disease, and anybody that has weakened immune systems such as people with cancer or on immunosuppressive therapy.

## 2019-02-14 NOTE — Telephone Encounter (Signed)
Pt called and states he tested negative on 10/17 for covid but then started having symptoms on 10/20 so she went yesterday and did the rapid test in [place] and it was positive. Husband was positive on 10/15. Pt is having following symptoms.  Some SOB, tired, congested, HA, cough, tightness, on and off achy ness.  Please advise

## 2019-02-14 NOTE — Telephone Encounter (Signed)
Pt was notified.  

## 2019-02-15 ENCOUNTER — Other Ambulatory Visit: Payer: Self-pay

## 2019-02-15 ENCOUNTER — Ambulatory Visit (INDEPENDENT_AMBULATORY_CARE_PROVIDER_SITE_OTHER): Payer: BC Managed Care – PPO | Admitting: Medical

## 2019-02-15 ENCOUNTER — Encounter: Payer: Self-pay | Admitting: Medical

## 2019-02-15 VITALS — Temp 97.4°F | Ht 67.0 in | Wt 159.0 lb

## 2019-02-15 DIAGNOSIS — U071 COVID-19: Secondary | ICD-10-CM

## 2019-02-15 DIAGNOSIS — R0600 Dyspnea, unspecified: Secondary | ICD-10-CM | POA: Diagnosis not present

## 2019-02-15 DIAGNOSIS — R05 Cough: Secondary | ICD-10-CM | POA: Diagnosis not present

## 2019-02-15 DIAGNOSIS — R059 Cough, unspecified: Secondary | ICD-10-CM

## 2019-02-15 MED ORDER — ALBUTEROL SULFATE HFA 108 (90 BASE) MCG/ACT IN AERS: 2.0000 | INHALATION_SPRAY | Freq: Four times a day (QID) | RESPIRATORY_TRACT | 0 refills | Status: DC | PRN

## 2019-02-15 MED ORDER — HYDROCODONE-HOMATROPINE 5-1.5 MG/5ML PO SYRP: 5.0000 mL | ORAL_SOLUTION | Freq: Three times a day (TID) | ORAL | 0 refills | Status: AC | PRN

## 2019-02-15 NOTE — Progress Notes (Signed)
This visit type was conducted due to national recommendations for restrictions regarding the COVID-19 Pandemic (e.g. social distancing) in an effort to limit this patient's exposure and mitigate transmission in our community.  Due to their co-morbid illnesses, this patient is at least at moderate risk for complications without adequate follow up.  This format is felt to be most appropriate for this patient at this time.    Documentation for virtual audio and video telecommunications through Zoom encounter:  The patient was located at home. The provider was located in the office. The patient did consent to this visit and is aware of possible charges through their insurance for this visit.  The other persons participating in this telemedicine service were none. Time spent on call was 15 minutes and in review of previous records >15 minutes total. This virtual service is not related to other E/M service within previous 7 days.   Subjective:  Chief Complaint  Patient presents with  . Follow-up    + covid cough, chest tightness, some difficulty breathing,achiness, lowerback pain,headache    Virtual consult today for positive Covid.  She first started having symptoms of scratchy throat and headache about a week ago.  Over the course of the week she has developed cough, tightness in the chest, shortness of breath, achiness, extreme fatigue and tiredness, low back pain.  No fever, no vomiting, no nausea, no loose stool.  No change in taste or smell.  She originally tested negative on October 17.  But then went to an urgent care in [place] and had a rapid test a few days ago was positive.  Her husband also tested positive and he does have symptoms.  Her son has no symptoms and has tested negative.  Her husband was the first to have symptoms in the household.  They are trying to be careful with mask and distancing to try to avoid their son from getting Covid.  No other aggravating or relieving  factors   Past Medical History:  Diagnosis Date  . ADHD (attention deficit hyperactivity disorder)   . Anxiety   . Complication of anesthesia 93   93hallucinations with anesthesia, 2001-slow to awaken  . Female infertility   . H/O bilateral salpingectomy    ovaries and uterus still present  . Headache(784.0)   . Hydrosalpinx 2012   bilat  . IBS (irritable bowel syndrome)    lactose intol  . Insomnia   . Polyarthralgia    knees, hips, fingers bilat  . PONV (postoperative nausea and vomiting)   . Raynaud phenomenon    Dr. Kellie Simmering, rheumatology consult 08/2011; +ANA  . Wears glasses    Ros as in subjective   Objective: Temp (!) 97.4 F (36.3 C)   Ht 5\' 7"  (1.702 m)   Wt 159 lb (72.1 kg)   BMI 24.90 kg/m   Not examined in person as this was a virtual consult     Assessment: Encounter Diagnoses  Name Primary?  . COVID-19 Yes  . Cough   . Dyspnea, unspecified type     Plan: We discussed her symptoms, concerns, positive Covid test, discussed supportive care, rest, hydration.  Can begin Hycodan for cough as below, can begin for symptom relief.  I ordered the Covid continue to have home monitoring through text during the next few days.  Advised if significantly worse with shortness of breath or dehydration or worse overall in general in the coming days then may need to go to emergency department or back to urgent  care.  We discussed self quarantine, trying to limit exposure.  Answered her questions.  Follow-up as needed  Devery was seen today for follow-up.  Diagnoses and all orders for this visit:  COVID-19 -     Temperature monitoring; Future  Cough -     Temperature monitoring; Future  Dyspnea, unspecified type -     Temperature monitoring; Future  Other orders -     albuterol (VENTOLIN HFA) 108 (90 Base) MCG/ACT inhaler; Inhale 2 puffs into the lungs every 6 (six) hours as needed for wheezing or shortness of breath. -     HYDROcodone-homatropine (HYCODAN)  5-1.5 MG/5ML syrup; Take 5 mLs by mouth every 8 (eight) hours as needed for up to 5 days. -     [place]

## 2019-02-16 ENCOUNTER — Encounter (INDEPENDENT_AMBULATORY_CARE_PROVIDER_SITE_OTHER): Payer: Self-pay

## 2019-03-29 ENCOUNTER — Other Ambulatory Visit: Payer: Self-pay

## 2019-03-29 ENCOUNTER — Ambulatory Visit: Payer: BC Managed Care – PPO | Admitting: Family Medicine

## 2019-03-29 ENCOUNTER — Encounter: Payer: Self-pay | Admitting: Family Medicine

## 2019-03-29 VITALS — BP 120/70 | HR 77 | Temp 97.7°F | Wt 162.4 lb

## 2019-03-29 DIAGNOSIS — M5442 Lumbago with sciatica, left side: Secondary | ICD-10-CM | POA: Diagnosis not present

## 2019-03-29 DIAGNOSIS — R202 Paresthesia of skin: Secondary | ICD-10-CM | POA: Diagnosis not present

## 2019-03-29 MED ORDER — IBUPROFEN 800 MG PO TABS: 800.0000 mg | ORAL_TABLET | Freq: Three times a day (TID) | ORAL | 0 refills | Status: DC | PRN

## 2019-03-29 NOTE — Progress Notes (Signed)
   Subjective:    Patient ID: Cheyenne [name], female    DOB: 09-23-1980, 38 y.o.   MRN: [id number]  HPI Chief Complaint  Patient presents with  . back pain    back pain near scitica  x last week, hurts on left side but radiates down right leg.    She is here with complaints of a one week history of gradually worsening left low back pain that radiated into her left buttock at first but now down into her posterior left leg to her knee. Denies injury.  States she has tingling in both legs for the past 2 days. This started after she picked her son up and carried him around over the weekend.   Pain is mild currently. She has not taken anything for her pain.  States she has been "resting" for pain relief. Using essential oils and heating pad for pain relief.  States she initially thought she had an ovarian cyst rupture because of similar pain in the past.   Denies leg weakness or fall.  Denies fever, chills, night sweats, chest pain, palpitations, shortness of breath, abdominal pain, N/V/D.  No loss of control of bowels or bladder. No saddle anesthesia.   LMP: 3 weeks ago.  Fallopian tubes removed.    Reviewed allergies, medications, past medical, surgical, family, and social history.    Review of Systems Pertinent positives and negatives in the history of present illness.     Objective:   Physical Exam Constitutional:      Appearance: Normal appearance.  Neck:     Musculoskeletal: Normal range of motion and neck supple.  Cardiovascular:     Rate and Rhythm: Normal rate and regular rhythm.     Pulses: Normal pulses.  Pulmonary:     Effort: Pulmonary effort is normal.     Breath sounds: Normal breath sounds.  Musculoskeletal:     Thoracic back: Normal.     Lumbar back: She exhibits pain. She exhibits normal range of motion, no tenderness and no spasm.     Right lower leg: No edema.     Left lower leg: No edema.     Comments: Normal sensation and motion of her back. Normal  skin and no TTP. She does report pain with extreme flexion and lateral bending. Negative straight leg raise. No weakness. DTRs are symmetric and normal.   Neurological:     Mental Status: She is alert.    BP 120/70   Pulse 77   Temp 97.7 F (36.5 C)   Wt 162 lb 6.4 oz (73.7 kg)   BMI 25.44 kg/m       Assessment & Plan:  Acute left-sided low back pain with left-sided sciatica - Plan: ibuprofen (ADVIL) 800 MG tablet  Paresthesia of both lower extremities - Plan: CBC with Differential, Comprehensive metabolic panel, Vitamin Z66  No red flag symptoms. Exam is benign.  We will try conservative treatment with ibuprofen 800 mg three times daily, heat, and topical analgesic. I will check labs and follow up. Discussed referral to PT and she declines currently.

## 2019-03-29 NOTE — Patient Instructions (Signed)
Try taking ibuprofen 800 mg three times daily with food and a full glass of water.  If this causes upset stomach, you may try taking over the counter Pepcid.   Continue using a heating pad 2-3 times per day as well as a topical pain medication such as Sal[place], Biofreeze, etc.   If you have any new or worsening symptoms, let us know.  We will be in touch with your lab results.     Acute Back Pain, Adult Acute back pain is sudden and usually short-lived. It is often caused by an injury to the muscles and tissues in the back. The injury may result from:  A muscle or ligament getting overstretched or torn (strained). Ligaments are tissues that connect bones to each other. Lifting something improperly can cause a back strain.  Wear and tear (degeneration) of the spinal disks. Spinal disks are circular tissue that provides cushioning between the bones of the spine (vertebrae).  Twisting motions, such as while playing sports or doing yard work.  A hit to the back.  Arthritis. You may have a physical exam, lab tests, and imaging tests to find the cause of your pain. Acute back pain usually goes away with rest and home care. Follow these instructions at home: Managing pain, stiffness, and swelling  Take over-the-counter and prescription medicines only as told by your health care provider.  Your health care provider may recommend applying ice during the first 24-48 hours after your pain starts. To do this: ? Put ice in a plastic bag. ? Place a towel between your skin and the bag. ? Leave the ice on for 20 minutes, 2-3 times a day.  If directed, apply heat to the affected area as often as told by your health care provider. Use the heat source that your health care provider recommends, such as a moist heat pack or a heating pad. ? Place a towel between your skin and the heat source. ? Leave the heat on for 20-30 minutes. ? Remove the heat if your skin turns bright red. This is especially  important if you are unable to feel pain, heat, or cold. You have a greater risk of getting burned. Activity   Do not stay in bed. Staying in bed for more than 1-2 days can delay your recovery.  Sit up and stand up straight. Avoid leaning forward when you sit, or hunching over when you stand. ? If you work at a desk, sit close to it so you do not need to lean over. Keep your chin tucked in. Keep your neck drawn back, and keep your elbows bent at a right angle. Your arms should look like the letter "L." ? Sit high and close to the steering wheel when you drive. Add lower back (lumbar) support to your car seat, if needed.  Take short walks on even surfaces as soon as you are able. Try to increase the length of time you walk each day.  Do not sit, drive, or stand in one place for more than 30 minutes at a time. Sitting or standing for long periods of time can put stress on your back.  Do not drive or use heavy machinery while taking prescription pain medicine.  Use proper lifting techniques. When you bend and lift, use positions that put less stress on your back: ? [place] your knees. ? Keep the load close to your body. ? Avoid twisting.  Exercise regularly as told by your health care provider. Exercising helps  your back heal faster and helps prevent back injuries by keeping muscles strong and flexible.  Work with a physical therapist to make a safe exercise program, as recommended by your health care provider. Do any exercises as told by your physical therapist. Lifestyle  Maintain a healthy weight. Extra weight puts stress on your back and makes it difficult to have good posture.  Avoid activities or situations that make you feel anxious or stressed. Stress and anxiety increase muscle tension and can make back pain worse. Learn ways to manage anxiety and stress, such as through exercise. General instructions  Sleep on a firm mattress in a comfortable position. Try lying on your side with  your knees slightly bent. If you lie on your back, put a pillow under your knees.  Follow your treatment plan as told by your health care provider. This may include: ? Cognitive or behavioral therapy. ? Acupuncture or massage therapy. ? Meditation or yoga. Contact a health care provider if:  You have pain that is not relieved with rest or medicine.  You have increasing pain going down into your legs or buttocks.  Your pain does not improve after 2 weeks.  You have pain at night.  You lose weight without trying.  You have a fever or chills. Get help right away if:  You develop new bowel or bladder control problems.  You have unusual weakness or numbness in your arms or legs.  You develop nausea or vomiting.  You develop abdominal pain.  You feel faint. Summary  Acute back pain is sudden and usually short-lived.  Use proper lifting techniques. When you bend and lift, use positions that put less stress on your back.  Take over-the-counter and prescription medicines and apply heat or ice as directed by your health care provider. This information is not intended to replace advice given to you by your health care provider. Make sure you discuss any questions you have with your health care provider. Document Released: 04/07/2005 Document Revised: 07/27/2018 Document Reviewed: 11/19/2016 Elsevier Patient Education  2020 [employer].

## 2019-03-30 LAB — COMPREHENSIVE METABOLIC PANEL
ALT: 11 IU/L (ref 0–32)
AST: 15 IU/L (ref 0–40)
Albumin/Globulin Ratio: 2 (ref 1.2–2.2)
Albumin: 4.4 g/dL (ref 3.8–4.8)
Alkaline Phosphatase: 54 IU/L (ref 39–117)
BUN/Creatinine Ratio: 15 (ref 9–23)
BUN: 10 mg/dL (ref 6–20)
Bilirubin Total: 0.3 mg/dL (ref 0.0–1.2)
CO2: 23 mmol/L (ref 20–29)
Calcium: 9.1 mg/dL (ref 8.7–10.2)
Chloride: 105 mmol/L (ref 96–106)
Creatinine, Ser: 0.65 mg/dL (ref 0.57–1.00)
GFR calc Af Amer: 132 mL/min/{1.73_m2} (ref >59)
GFR calc non Af Amer: 115 mL/min/{1.73_m2} (ref >59)
Globulin, Total: 2.2 g/dL (ref 1.5–4.5)
Glucose: 91 mg/dL (ref 65–99)
Potassium: 3.9 mmol/L (ref 3.5–5.2)
Sodium: 141 mmol/L (ref 134–144)
Total Protein: 6.6 g/dL (ref 6.0–8.5)

## 2019-03-30 LAB — CBC WITH DIFFERENTIAL/PLATELET
Basophils Absolute: 0.1 10*3/uL (ref 0.0–0.2)
Basos: 2 %
EOS (ABSOLUTE): 0.2 10*3/uL (ref 0.0–0.4)
Eos: 3 %
Hematocrit: 38.4 % (ref 34.0–46.6)
Hemoglobin: 13.2 g/dL (ref 11.1–15.9)
Immature Grans (Abs): 0 10*3/uL (ref 0.0–0.1)
Immature Granulocytes: 0 %
Lymphocytes Absolute: 2.1 10*3/uL (ref 0.7–3.1)
Lymphs: 32 %
MCH: 30.1 pg (ref 26.6–33.0)
MCHC: 34.4 g/dL (ref 31.5–35.7)
MCV: 88 fL (ref 79–97)
Monocytes Absolute: 0.5 10*3/uL (ref 0.1–0.9)
Monocytes: 8 %
Neutrophils Absolute: 3.5 10*3/uL (ref 1.4–7.0)
Neutrophils: 55 %
Platelets: 246 10*3/uL (ref 150–450)
RBC: 4.39 x10E6/uL (ref 3.77–5.28)
RDW: 12.2 % (ref 11.7–15.4)
WBC: 6.4 10*3/uL (ref 3.4–10.8)

## 2019-03-30 LAB — VITAMIN B12: Vitamin B-12: 251 pg/mL (ref 232–1245)

## 2020-01-18 ENCOUNTER — Ambulatory Visit: Payer: BC Managed Care – PPO | Admitting: Family Medicine

## 2020-01-19 ENCOUNTER — Ambulatory Visit (INDEPENDENT_AMBULATORY_CARE_PROVIDER_SITE_OTHER): Payer: BC Managed Care – PPO | Admitting: Family Medicine

## 2020-01-19 ENCOUNTER — Encounter: Payer: Self-pay | Admitting: Family Medicine

## 2020-01-19 ENCOUNTER — Ambulatory Visit (HOSPITAL_COMMUNITY)
Admission: RE | Admit: 2020-01-19 | Discharge: 2020-01-19 | Disposition: A | Discharge status: Home / Self Care | Payer: BC Managed Care – PPO | Source: Ambulatory Visit | Attending: Family Medicine | Admitting: Family Medicine

## 2020-01-19 ENCOUNTER — Other Ambulatory Visit: Payer: Self-pay

## 2020-01-19 VITALS — BP 120/60 | HR 79 | Temp 98.3°F | Wt 168.4 lb

## 2020-01-19 DIAGNOSIS — H532 Diplopia: Secondary | ICD-10-CM | POA: Insufficient documentation

## 2020-01-19 DIAGNOSIS — R519 Headache, unspecified: Secondary | ICD-10-CM

## 2020-01-19 DIAGNOSIS — M25532 Pain in left wrist: Secondary | ICD-10-CM

## 2020-01-19 DIAGNOSIS — R5383 Other fatigue: Secondary | ICD-10-CM

## 2020-01-19 DIAGNOSIS — G8929 Other chronic pain: Secondary | ICD-10-CM

## 2020-01-19 DIAGNOSIS — M25551 Pain in right hip: Secondary | ICD-10-CM

## 2020-01-19 DIAGNOSIS — M25572 Pain in left ankle and joints of left foot: Secondary | ICD-10-CM

## 2020-01-19 DIAGNOSIS — E559 Vitamin D deficiency, unspecified: Secondary | ICD-10-CM

## 2020-01-19 DIAGNOSIS — M25571 Pain in right ankle and joints of right foot: Secondary | ICD-10-CM

## 2020-01-19 DIAGNOSIS — M25552 Pain in left hip: Secondary | ICD-10-CM

## 2020-01-19 DIAGNOSIS — M25531 Pain in right wrist: Secondary | ICD-10-CM | POA: Diagnosis not present

## 2020-01-19 MED ORDER — GADOBUTROL 1 MMOL/ML IV SOLN
7.5000 mL | Freq: Once | INTRAVENOUS | Status: AC | PRN
  Administered 2020-01-19: 7.5 mL via INTRAVENOUS

## 2020-01-19 NOTE — Progress Notes (Signed)
Subjective:    Patient ID: Cheyenne [name], female    DOB: [birth year], 39 y.o.   MRN: [id number]  HPI Chief Complaint  Patient presents with  . joint pain    joint pain/ fatigue since college but getting worse the last few months, left eye vision issues on monday and could not see much- lasted about 40-45 and then had headache afterwards, happen on tuesday   She is here with complaints of worsening joint pain since college. States mainly her in her hips. Reports constant pain but is able to sleep. Pain does not wake her up. She does notice discomfort as soon as she wakes up.  Sitting makes her pain worse. She has to walk and eventually her joints improve with activity.  She had cortisone injections in both hips. This helped for a while. She had PT and learned how to exercise hip flexors.  Was seeing [facility]. States she was told she had bursitis.  States she also saw a rheumatologist in the past.  Over the past few weeks her wrists have become painful and her ankles are often stiff.   States 5 days ago she woke up and felt severe fatigue as well as having arthralgias. No other symptoms at that time.  She felt quite ill, even with walking. She also felt "puffy". Thought she may have been dehydrated so she increased her fluids.  She felt the same the following day. 2  States 2 days ago, she was in court (works as Clinical research associate) and was talking to someone without her glasses on and noticed vision changes across her left eye. She also noticed changes on the right but significantly less. States she had a "squiggly, jumping line"  that looked like a "kaleidascope". She could not read, all the letters were "on top of each other", double vision.  No loss of vision.  Altered vision lasted for 45 minutes. She then had a headache.  She did not take medication, states she does not like to take medication in general. Uses essential oils typically.   States the same symptoms occured the next day (with  her glasses on). Lasted for the same length of time.  Took ibuprofen 800 mg and it helped.  Yesterday, she felt better. Back to baseline but with usual joint issues.   States she has a deep pain in her right thigh intermittent that is not new but more frequent. States she has talked to her PCP about this.   Ran track in HS. Also danced and swam.    Denies fever, chills, body aches, dizziness, tinnitus, chest pain, palpitations, shortness of breath, abdominal pain, N/V/D, urinary symptoms, LE edema.  No numbness, tingling or weakness.   Family history of fibromyalgia and ?Lupus  LMP: 2 weeks ago  No new medications.  No changes in caffeine level.  Alcohol use is unchanged.   History of Covid in 02/2019.   Recently had to get rabies vaccine due to bats in her house.    Review of Systems Pertinent positives and negatives in the history of present illness.     Objective:   Physical Exam Constitutional:      General: She is not in acute distress.    Appearance: Normal appearance. She is not ill-appearing.  HENT:     Right Ear: Tympanic membrane and ear canal normal.     Left Ear: Tympanic membrane and ear canal normal.  Eyes:     General: Lids are normal. Vision grossly intact.  No visual field deficit.    Extraocular Movements: Extraocular movements intact.     Conjunctiva/sclera: Conjunctivae normal.     Pupils: Pupils are equal, round, and reactive to light.     Comments: Visual acuity normal   Cardiovascular:     Rate and Rhythm: Normal rate and regular rhythm.     Pulses: Normal pulses.     Heart sounds: Normal heart sounds.  Pulmonary:     Effort: Pulmonary effort is normal.     Breath sounds: Normal breath sounds.  Musculoskeletal:        General: Normal range of motion.     Cervical back: Normal range of motion and neck supple.  Lymphadenopathy:     Cervical: No cervical adenopathy.  Skin:    General: Skin is warm and dry.     Capillary Refill: Capillary  refill takes less than 2 seconds.  Neurological:     General: No focal deficit present.     Mental Status: She is alert and oriented to person, place, and time.     Cranial Nerves: No cranial nerve deficit.     Sensory: No sensory deficit.     Motor: No weakness.     Coordination: Romberg sign negative. Coordination normal. Finger-Nose-Finger Test and Heel to [facility] Test normal.     Gait: Gait normal.     Deep Tendon Reflexes: Reflexes normal.  Psychiatric:        Attention and Perception: Attention normal.        Mood and Affect: Mood normal.        Speech: Speech normal.        Behavior: Behavior normal.        Thought Content: Thought content normal.        Cognition and Memory: Cognition and memory normal.    BP 120/60   Pulse 79   Temp 98.3 F (36.8 C)   Wt 168 lb 6.4 oz (76.4 kg)   SpO2 98%   BMI 26.38 kg/m       Assessment & Plan:  Double vision - Plan: CBC with Differential/Platelet, Comprehensive metabolic panel, MR Brain W Wo Contrast -2 episodes lasting approximately 45 minutes and preceded headache. Not present today. Visual acuity essentially normal today and normal visual fields today. Consider referral to neurologist.   New onset headache - Plan: MR Brain W Wo Contrast -headaches are new and occur after ocular symptoms. Discussed she may have ocular headaches. Recommend MRI of her brain. She may try 2 Aleve at onset of ocular symptoms or headaches.   Consider referral to neurologist.   Chronic pain of both hips - Plan: Sedimentation rate, ANA, Rheumatoid factor -persistent pain but worsening. Hx of evaluation by PCP, ortho, rheumatology. Follow up pending labs. We may need to have her see ortho again.   Bilateral wrist pain - Plan: Sedimentation rate, ANA, Rheumatoid factor Follow up pending results.   Fatigue, unspecified type - Plan: CBC with Differential/Platelet, Comprehensive metabolic panel, TSH, T4, free, T3, VITAMIN D 25 Hydroxy (Vit-D Deficiency,  Fractures), Vitamin B12  Vitamin D deficiency - Plan: VITAMIN D 25 Hydroxy (Vit-D Deficiency, Fractures) -hx of vitamin D def.   Chronic pain of both ankles - Plan: Rheumatoid factor -consider ortho vs rheumatology referral.    Approval for MRI Brain with /without contrast- [id number] valid 9/30-3/28/22 at [facility]

## 2020-01-20 ENCOUNTER — Encounter: Payer: Self-pay | Admitting: Family Medicine

## 2020-01-20 ENCOUNTER — Other Ambulatory Visit: Payer: Self-pay | Admitting: Internal Medicine

## 2020-01-20 DIAGNOSIS — G8929 Other chronic pain: Secondary | ICD-10-CM

## 2020-01-20 DIAGNOSIS — M25532 Pain in left wrist: Secondary | ICD-10-CM

## 2020-01-20 DIAGNOSIS — H532 Diplopia: Secondary | ICD-10-CM

## 2020-01-20 DIAGNOSIS — R519 Headache, unspecified: Secondary | ICD-10-CM

## 2020-01-20 LAB — COMPREHENSIVE METABOLIC PANEL
ALT: 16 IU/L (ref 0–32)
AST: 21 IU/L (ref 0–40)
Albumin/Globulin Ratio: 2.1 (ref 1.2–2.2)
Albumin: 4.7 g/dL (ref 3.8–4.8)
Alkaline Phosphatase: 57 IU/L (ref 44–121)
BUN/Creatinine Ratio: 18 (ref 9–23)
BUN: 12 mg/dL (ref 6–20)
Bilirubin Total: 0.3 mg/dL (ref 0.0–1.2)
CO2: 22 mmol/L (ref 20–29)
Calcium: 9.1 mg/dL (ref 8.7–10.2)
Chloride: 101 mmol/L (ref 96–106)
Creatinine, Ser: 0.68 mg/dL (ref 0.57–1.00)
GFR calc Af Amer: 129 mL/min/{1.73_m2} (ref >59)
GFR calc non Af Amer: 112 mL/min/{1.73_m2} (ref >59)
Globulin, Total: 2.2 g/dL (ref 1.5–4.5)
Glucose: 89 mg/dL (ref 65–99)
Potassium: 4.5 mmol/L (ref 3.5–5.2)
Sodium: 138 mmol/L (ref 134–144)
Total Protein: 6.9 g/dL (ref 6.0–8.5)

## 2020-01-20 LAB — CBC WITH DIFFERENTIAL/PLATELET
Basophils Absolute: 0.1 10*3/uL (ref 0.0–0.2)
Basos: 1 %
EOS (ABSOLUTE): 0.1 10*3/uL (ref 0.0–0.4)
Eos: 2 %
Hematocrit: 41.9 % (ref 34.0–46.6)
Hemoglobin: 14.1 g/dL (ref 11.1–15.9)
Immature Grans (Abs): 0 10*3/uL (ref 0.0–0.1)
Immature Granulocytes: 0 %
Lymphocytes Absolute: 1.9 10*3/uL (ref 0.7–3.1)
Lymphs: 33 %
MCH: 29.8 pg (ref 26.6–33.0)
MCHC: 33.7 g/dL (ref 31.5–35.7)
MCV: 89 fL (ref 79–97)
Monocytes Absolute: 0.5 10*3/uL (ref 0.1–0.9)
Monocytes: 8 %
Neutrophils Absolute: 3.3 10*3/uL (ref 1.4–7.0)
Neutrophils: 56 %
Platelets: 263 10*3/uL (ref 150–450)
RBC: 4.73 x10E6/uL (ref 3.77–5.28)
RDW: 11.8 % (ref 11.7–15.4)
WBC: 6 10*3/uL (ref 3.4–10.8)

## 2020-01-20 LAB — TSH: TSH: 3.08 u[IU]/mL (ref 0.450–4.500)

## 2020-01-20 LAB — T3: T3, Total: 91 ng/dL (ref 71–180)

## 2020-01-20 LAB — SEDIMENTATION RATE: Sed Rate: 11 mm/hr (ref 0–32)

## 2020-01-20 LAB — T4, FREE: Free T4: 1.36 ng/dL (ref 0.82–1.77)

## 2020-01-20 LAB — VITAMIN B12: Vitamin B-12: 258 pg/mL (ref 232–1245)

## 2020-01-20 LAB — VITAMIN D 25 HYDROXY (VIT D DEFICIENCY, FRACTURES): Vit D, 25-Hydroxy: 37.6 ng/mL (ref 30.0–100.0)

## 2020-01-20 LAB — ANA: Anti Nuclear Antibody (ANA): NEGATIVE

## 2020-01-20 LAB — RHEUMATOID FACTOR: Rheumatoid fact SerPl-aCnc: <10 IU/mL (ref 0.0–13.9)

## 2020-01-20 NOTE — Progress Notes (Signed)
Please refer her to neurology for new headaches and visual disturbances.

## 2020-02-07 ENCOUNTER — Encounter: Payer: Self-pay | Admitting: Medical

## 2020-02-07 ENCOUNTER — Other Ambulatory Visit: Payer: Self-pay

## 2020-02-07 ENCOUNTER — Ambulatory Visit: Payer: BC Managed Care – PPO | Admitting: Medical

## 2020-02-07 VITALS — BP 108/72 | HR 70 | Ht 67.0 in | Wt 167.6 lb

## 2020-02-07 DIAGNOSIS — M2550 Pain in unspecified joint: Secondary | ICD-10-CM

## 2020-02-07 DIAGNOSIS — R0600 Dyspnea, unspecified: Secondary | ICD-10-CM | POA: Insufficient documentation

## 2020-02-07 DIAGNOSIS — M7072 Other bursitis of hip, left hip: Secondary | ICD-10-CM | POA: Insufficient documentation

## 2020-02-07 DIAGNOSIS — Z8616 Personal history of COVID-19: Secondary | ICD-10-CM | POA: Insufficient documentation

## 2020-02-07 DIAGNOSIS — R0602 Shortness of breath: Secondary | ICD-10-CM | POA: Diagnosis not present

## 2020-02-07 DIAGNOSIS — M7071 Other bursitis of hip, right hip: Secondary | ICD-10-CM | POA: Insufficient documentation

## 2020-02-07 DIAGNOSIS — M25531 Pain in right wrist: Secondary | ICD-10-CM

## 2020-02-07 DIAGNOSIS — G43109 Migraine with aura, not intractable, without status migrainosus: Secondary | ICD-10-CM

## 2020-02-07 DIAGNOSIS — M25532 Pain in left wrist: Secondary | ICD-10-CM

## 2020-02-07 DIAGNOSIS — Z8269 Family history of other diseases of the musculoskeletal system and connective tissue: Secondary | ICD-10-CM | POA: Insufficient documentation

## 2020-02-07 MED ORDER — BREO ELLIPTA 100-25 MCG/INH IN AEPB: 1.0000 | INHALATION_SPRAY | Freq: Every day | RESPIRATORY_TRACT | 0 refills | Status: DC

## 2020-02-07 NOTE — Progress Notes (Signed)
Subjective:  Cheyenne [name] is a 39 y.o. female who presents for Chief Complaint  Patient presents with  . Follow-up    sob after covid 1 year ago    Follow up for COVID  The patient was last seen for this 1-2 years ago. Changes made at last visit include inhaler for SOB.  She reports good compliance with treatment. She feels that condition is Unchanged. She is not having side effects. Still feels SOB now.   -----------------------------------------------------------------------------------------    History per Jac Canavan, PA-C:  Romeo Rabon NP here a few weeks ago.    Had covid about a year ago.    Still has SOB, breathing issues every now and then.   still uses inhaler every now and then, albuterol.  No wheezing.  No chest pain no palpitations.  She has an apple watch that does not show any weird arrhythmia.  No productive cough, no hemoptysis.  She has about 2 mild headaches per week but recently had to significant migraines out of the blue in the same week.  She was in the middle of court and had visual disturbances.  She came into the office here had a evaluation had an MRI which was normal.  She was referred to neurology and is awaiting that appointment date.  She has not had any additional migraines since.  She has ongoing joint pains in numerous joints throughout her body.  She does get pains in her wrist and her fingers but no obvious swelling.  She did have one episode in recent weeks where her right hand was swollen and red but other than that note recent swelling.  She does get does get pain in both hips.  No recent fall or trauma.  She is seeing rheumatology in the prior years without obvious diagnosis of rheumatoid or autoimmune disease.  Her mom has a history of fibromyalgia and has had similar screening for autoimmune disease in the past without significant diagnosis  She does have appointment upcoming with orthopedist to establish care  No other aggravating or  relieving factors.    No other c/o.  Family History  Problem Relation Age of Onset  . Diabetes Father   . Heart disease Maternal Uncle   . Cancer Maternal Grandmother        skin  . Stroke Maternal Grandmother   . Diabetes Paternal Grandfather   . Cancer Paternal Grandfather        skin  . Fibromyalgia Mother   . Hypertension Mother   . Barrett's esophagus Mother   . Depression Mother   . Hyperlipidemia Mother   . Heart disease Other        neice with congential heart defect     The following portions of the patient's history were reviewed and updated as appropriate: allergies, current medications, past family history, past medical history, past social history, past surgical history and problem list.  ROS Otherwise as in subjective above   Objective: BP 108/72   Pulse 70   Ht 5\' 7"  (1.702 m)   Wt 167 lb 9.6 oz (76 kg)   SpO2 98%   BMI 26.25 kg/m   General appearance: alert, no distress, well developed, well nourished HEENT: normocephalic, sclerae anicteric, conjunctiva pink and moist,  nares patent, no discharge or erythema,  Neck: supple, no lymphadenopathy, no thyromegaly, no masses Heart: RRR, normal S1, S2, no murmurs Lungs: CTA bilaterally, no wheezes, rhonchi, or rales Pulses: 2+ radial pulses, 2+ pedal pulses, normal  cap refill Ext: no edema Neuro: CN II through XII intact, nonfocal exam MSK: No obvious tenderness I would hands, no obvious bony deformity of hands or wrist, normal range of motion of fingers and wrist, tender over bilateral hip bursa, but normal hip range of motion bilaterally, rest of upper lower extremities unremarkable, no obvious swelling   Assessment: Encounter Diagnoses  Name Primary?  . SOB (shortness of breath) Yes  . Dyspnea, unspecified type   . History of COVID-19   . Polyarthralgia   . Bilateral wrist pain   . Bursitis of both hips, unspecified bursa   . Ocular migraine   . Family history of fibromyalgia      Plan: I  reviewed her recent visit she had here with my colleague Hetty Blend NP, recent MRI brain which is normal, recent lab work.  Interestingly she had a potential bat exposure in the household and the whole family had to get rabies vaccines  We discussed her several concerns, history of Covid infection this past year and ongoing symptoms of concern.  We discussed differential diagnoses.  Shortness of breath and dyspnea ongoing.  PFT reviewed today which was somewhat abnormal.  She will begin trial of Breo inhaler, 1 puff daily  Rinse mouth out with water after use, swish and swallow  Call back or MyChart messaging a few weeks to let me know if this is helping or not  If not much improved over the next few weeks consider chest x-ray  If she is much improved over the next few weeks we can consider discontinuing the medication in the next month and see how she does  I suspect some residual inflammation or scarring since Covid   Polyarthralgia, wrist pains, hip pains, bursitis  We discussed several possible causes of joint pains  I recommend regular exercise and stretching  I recommend she begin over-the-counter reinforced wrist splints at bedtime for at least the next week to 10 days for possible wrist tendinitis  She can use cold therapy such as ice pack 20 minutes on and off to the wrist  She can use over-the-counter Aleve daily for the next 7 to 10 days  Avoid lots of use of stairs, deep bends over up and down stairs and bending given the hip bursitis  Of note, there was no obvious swelling of the joints today on exam, no significant bony joint abnormality  Recent lab screen was negative for obvious rheumatoid screen  She does have orthopedic appointment coming up   Recent headaches, ocular migraine  Reduce stress where possible  Try and get 7 to 8 hours of sleep nightly  Drink plenty of water throughout the day, avoid dehydration  Use Aleve daily for the next 7 to 10  days for the joint pains above, otherwise can use Aleve or ibuprofen for acute headache sparingly  Continue plan to establish care with neurology for consult soon      Mckensi was seen today for follow-up.  Diagnoses and all orders for this visit:  SOB (shortness of breath) -     Spirometry with graph  Dyspnea, unspecified type  History of COVID-19  Polyarthralgia  Bilateral wrist pain  Bursitis of both hips, unspecified bursa  Ocular migraine  Family history of fibromyalgia  Other orders -     fluticasone furoate-vilanterol (BREO ELLIPTA) 100-25 MCG/INH AEPB; Inhale 1 puff into the lungs daily.    Follow up: pending referrals

## 2020-02-07 NOTE — Progress Notes (Signed)
done

## 2020-02-07 NOTE — Patient Instructions (Signed)
We discussed her several concerns, history of Covid infection this past year and ongoing symptoms of concern.  We discussed differential diagnoses.  Shortness of breath and dyspnea ongoing.  PFT reviewed today which was somewhat abnormal.  She will begin trial of Breo inhaler, 1 puff daily  Rinse mouth out with water after use, swish and swallow  Call back or MyChart messaging a few weeks to let me know if this is helping or not  If not much improved over the next few weeks consider chest x-ray  If she is much improved over the next few weeks we can consider discontinuing the medication in the next month and see how she does  I suspect some residual inflammation or scarring since Covid   Polyarthralgia, wrist pains, hip pains, bursitis  We discussed several possible causes of joint pains  I recommend regular exercise and stretching  I recommend she begin over-the-counter reinforced wrist splints at bedtime for at least the next week to 10 days for possible wrist tendinitis  She can use cold therapy such as ice pack 20 minutes on and off to the wrist  She can use over-the-counter Aleve daily for the next 7 to 10 days  Avoid lots of use of stairs, deep bends over up and down stairs and bending given the hip bursitis  Of note, there was no obvious swelling of the joints today on exam, no significant bony joint abnormality  Recent lab screen was negative for obvious rheumatoid screen  She does have orthopedic appointment coming up   Recent headaches, ocular migraine  Reduce stress where possible  Try and get 7 to 8 hours of sleep nightly  Drink plenty of water throughout the day, avoid dehydration  Use Aleve daily for the next 7 to 10 days for the joint pains above, otherwise can use Aleve or ibuprofen for acute headache sparingly  Continue plan to establish care with neurology for consult soon     Migraine Headache A migraine headache is an intense, throbbing pain  on one side or both sides of the head. Migraine headaches may also cause other symptoms, such as nausea, vomiting, and sensitivity to light and noise. A migraine headache can last from 4 hours to 3 days. Talk with your doctor about what things may bring on (trigger) your migraine headaches. What are the causes? The exact cause of this condition is not known. However, a migraine may be caused when nerves in the brain become irritated and release chemicals that cause inflammation of blood vessels. This inflammation causes pain. This condition may be triggered or caused by:  Drinking alcohol.  Smoking.  Taking medicines, such as: ? Medicine used to treat chest pain (nitroglycerin). ? Birth control pills. ? Estrogen. ? Certain blood pressure medicines.  Eating or drinking products that contain nitrates, glutamate, aspartame, or tyramine. Aged cheeses, chocolate, or caffeine may also be triggers.  Doing physical activity. Other things that may trigger a migraine headache include:  Menstruation.  Pregnancy.  Hunger.  Stress.  Lack of sleep or too much sleep.  Weather changes.  Fatigue. What increases the risk? The following factors may make you more likely to experience migraine headaches:  Being a certain age. This condition is more common in people who are 58-59 years old.  Being female.  Having a family history of migraine headaches.  Being Caucasian.  Having a mental health condition, such as depression or anxiety.  Being obese. What are the signs or symptoms? The main symptom  of this condition is pulsating or throbbing pain. This pain may:  Happen in any area of the head, such as on one side or both sides.  Interfere with daily activities.  Get worse with physical activity.  Get worse with exposure to bright lights or loud noises. Other symptoms may include:  Nausea.  Vomiting.  Dizziness.  General sensitivity to bright lights, loud noises, or  smells. Before you get a migraine headache, you may get warning signs (an aura). An aura may include:  Seeing flashing lights or having blind spots.  Seeing bright spots, halos, or zigzag lines.  Having tunnel vision or blurred vision.  Having numbness or a tingling feeling.  Having trouble talking.  Having muscle weakness. Some people have symptoms after a migraine headache (postdromal phase), such as:  Feeling tired.  Difficulty concentrating. How is this diagnosed? A migraine headache can be diagnosed based on:  Your symptoms.  A physical exam.  Tests, such as: ? CT scan or an MRI of the head. These imaging tests can help rule out other causes of headaches. ? Taking fluid from the spine (lumbar puncture) and analyzing it (cerebrospinal fluid analysis, or CSF analysis). How is this treated? This condition may be treated with medicines that:  Relieve pain.  Relieve nausea.  Prevent migraine headaches. Treatment for this condition may also include:  Acupuncture.  Lifestyle changes like avoiding foods that trigger migraine headaches.  Biofeedback.  Cognitive behavioral therapy. Follow these instructions at home: Medicines  Take over-the-counter and prescription medicines only as told by your health care provider.  Ask your health care provider if the medicine prescribed to you: ? Requires you to avoid driving or using heavy machinery. ? Can cause constipation. You may need to take these actions to prevent or treat constipation:  Drink enough fluid to keep your urine pale yellow.  Take over-the-counter or prescription medicines.  Eat foods that are high in fiber, such as beans, whole grains, and fresh fruits and vegetables.  Limit foods that are high in fat and processed sugars, such as fried or sweet foods. Lifestyle  Do not drink alcohol.  Do not use any products that contain nicotine or tobacco, such as cigarettes, e-cigarettes, and chewing tobacco.  If you need help quitting, ask your health care provider.  Get at least 8 hours of sleep every night.  Find ways to manage stress, such as meditation, deep breathing, or yoga. General instructions      Keep a journal to find out what may trigger your migraine headaches. For example, write down: ? What you eat and drink. ? How much sleep you get. ? Any change to your diet or medicines.  If you have a migraine headache: ? Avoid things that make your symptoms worse, such as bright lights. ? It may help to lie down in a dark, quiet room. ? Do not drive or use heavy machinery. ? Ask your health care provider what activities are safe for you while you are experiencing symptoms.  Keep all follow-up visits as told by your health care provider. This is important. Contact a health care provider if:  You develop symptoms that are different or more severe than your usual migraine headache symptoms.  You have more than 15 headache days in one month. Get help right away if:  Your migraine headache becomes severe.  Your migraine headache lasts longer than 72 hours.  You have a fever.  You have a stiff neck.  You have vision loss.  Your muscles feel weak or like you cannot control them.  You start to lose your balance often.  You have trouble walking.  You faint.  You have a seizure. Summary  A migraine headache is an intense, throbbing pain on one side or both sides of the head. Migraines may also cause other symptoms, such as nausea, vomiting, and sensitivity to light and noise.  This condition may be treated with medicines and lifestyle changes. You may also need to avoid certain things that trigger a migraine headache.  Keep a journal to find out what may trigger your migraine headaches.  Contact your health care provider if you have more than 15 headache days in a month or you develop symptoms that are different or more severe than your usual migraine headache  symptoms. This information is not intended to replace advice given to you by your health care provider. Make sure you discuss any questions you have with your health care provider. Document Revised: 07/30/2018 Document Reviewed: 05/20/2018 Elsevier Patient Education  2020 [employer].

## 2020-04-19 ENCOUNTER — Other Ambulatory Visit (INDEPENDENT_AMBULATORY_CARE_PROVIDER_SITE_OTHER): Payer: BC Managed Care – PPO

## 2020-04-19 ENCOUNTER — Encounter: Payer: Self-pay | Admitting: Medical

## 2020-04-19 ENCOUNTER — Telehealth: Payer: BC Managed Care – PPO | Admitting: Medical

## 2020-04-19 VITALS — Temp 98.4°F | Ht 67.0 in | Wt 168.0 lb

## 2020-04-19 DIAGNOSIS — R059 Cough, unspecified: Secondary | ICD-10-CM

## 2020-04-19 DIAGNOSIS — Z20822 Contact with and (suspected) exposure to covid-19: Secondary | ICD-10-CM

## 2020-04-19 DIAGNOSIS — J029 Acute pharyngitis, unspecified: Secondary | ICD-10-CM

## 2020-04-19 LAB — POCT INFLUENZA A/B
Influenza A, POC: NEGATIVE
Influenza B, POC: NEGATIVE

## 2020-04-19 LAB — POC COVID19 BINAXNOW: SARS Coronavirus 2 Ag: NEGATIVE

## 2020-04-19 MED ORDER — EMERGEN-C IMMUNE PLUS PO PACK: 1.0000 | PACK | Freq: Two times a day (BID) | ORAL | 0 refills | Status: DC

## 2020-04-19 MED ORDER — ALBUTEROL SULFATE HFA 108 (90 BASE) MCG/ACT IN AERS
2.0000 | INHALATION_SPRAY | Freq: Four times a day (QID) | RESPIRATORY_TRACT | 0 refills | Status: DC | PRN
Start: 2020-04-19 — End: 2020-10-24

## 2020-04-19 MED ORDER — HYDROCODONE-HOMATROPINE 5-1.5 MG/5ML PO SYRP: 5.0000 mL | ORAL_SOLUTION | Freq: Three times a day (TID) | ORAL | 0 refills | Status: DC | PRN

## 2020-04-19 NOTE — Progress Notes (Signed)
Subjective:     Patient ID: Cheyenne [name], female   DOB: 12-May-1980, 39 y.o.   MRN: [id number]  This visit type was conducted due to national recommendations for restrictions regarding the COVID-19 Pandemic (e.g. social distancing) in an effort to limit this patient's exposure and mitigate transmission in our community.  Due to their co-morbid illnesses, this patient is at least at moderate risk for complications without adequate follow up.  This format is felt to be most appropriate for this patient at this time.    Documentation for virtual audio and video telecommunications through [facility] encounter:  The patient was located at home. The provider was located in the office. The patient did consent to this visit and is aware of possible charges through their insurance for this visit.  The other persons participating in this telemedicine service were none. Time spent on call was 20 minutes and in review of previous records 20 minutes total.  This virtual service is not related to other E/M service within previous 7 days.   HPI Chief Complaint  Patient presents with  . Cough    VIRTUAL cough, ST, fatigue, body aches, drainage in her throat and sinus congestion. No fever. Symptoms started Tuesday evening. Had Christmas at her house Sunday, two cousins tested positive today for Covid-their symptoms also started Tuesday. Her aunt tested for flu, symptoms started on Monday. Her Grandmother started feeling badly also and she is at ER, but has been diagnosed with either as of yet.    Virtual consult for illness.  She notes cough, sore throat, aches, fatigue, some congestion.    No fever, no NVD, no loss of taste or smell.  No sob , no wheezing.  No major headache.   No one at her house on Christmas seemed sick, but found out some at her house felt sick the next day, and grandmother tested flu+ a few days later, and some cousins tested positive for covid a few days later.     Using nothing for  symptoms, but has used some lemon and bourbon.  No covid testing.   Has not had the vaccine but had covid infection 01/2019.  At that time used prescription cough medication and inhaler and didn't get hospitalized.    Past Medical History:  Diagnosis Date  . ADHD (attention deficit hyperactivity disorder)   . Anxiety   . Complication of anesthesia 93   93hallucinations with anesthesia, 2001-slow to awaken  . Female infertility   . H/O bilateral salpingectomy    ovaries and uterus still present  . Headache(784.0)   . Hydrosalpinx 2012   bilat  . IBS (irritable bowel syndrome)    lactose intol  . Insomnia   . Polyarthralgia    knees, hips, fingers bilat  . PONV (postoperative nausea and vomiting)   . Raynaud phenomenon    Dr. Kellie Simmering, rheumatology consult 08/2011; +ANA  . Wears glasses     Review of Systems As in subjective    Objective:   Physical Exam Due to coronavirus pandemic stay at home measures, patient visit was virtual and they were not examined in person.   Temp 98.4 F (36.9 C) (Tympanic)   Ht 5\' 7"  (1.702 m)   Wt 168 lb (76.2 kg)   LMP 04/03/2020 (Approximate)   BMI 26.31 kg/m       Assessment:     Encounter Diagnoses  Name Primary?  . Cough Yes  . Sore throat   . Close exposure to COVID-19 virus  Plan:     discussed symptoms, concerns, exposures.  She will come now for testing.  dicussed supportive care, rest, hydration, OTC remedies for congestion/cough, and quarantine measures.     If much worse in the next few days, then call or recheck  Cheyenne [name] was seen today for cough.  Diagnoses and all orders for this visit:  Cough -     POC COVID-19 BinaxNow; Future -     Novel Coronavirus, NAA ([facility]); Future -     Influenza A/B; Future  Sore throat -     POC COVID-19 BinaxNow; Future -     Novel Coronavirus, NAA ([facility]); Future -     Influenza A/B; Future  Close exposure to COVID-19 virus -     POC COVID-19 BinaxNow; Future -      Novel Coronavirus, NAA ([facility]); Future -     Influenza A/B; Future  f/u now in our back parking lot for flu and covid testing

## 2020-04-22 LAB — NOVEL CORONAVIRUS, NAA: SARS-CoV-2, NAA: DETECTED — AB

## 2020-04-24 ENCOUNTER — Telehealth: Payer: Self-pay

## 2020-04-24 ENCOUNTER — Other Ambulatory Visit: Payer: Self-pay

## 2020-04-24 ENCOUNTER — Other Ambulatory Visit: Payer: Self-pay | Admitting: Medical

## 2020-04-24 MED ORDER — HYDROCOD POLST-CPM POLST ER 10-8 MG/5ML PO SUER: 5.0000 mL | Freq: Two times a day (BID) | ORAL | 0 refills | Status: DC

## 2020-04-24 MED ORDER — BREO ELLIPTA 100-25 MCG/INH IN AEPB
1.0000 | INHALATION_SPRAY | Freq: Every day | RESPIRATORY_TRACT | 1 refills | Status: DC
Start: 2020-04-24 — End: 2021-02-08

## 2020-04-24 NOTE — Telephone Encounter (Signed)
Message has been sent to patient via mychart  

## 2020-04-24 NOTE — Telephone Encounter (Signed)
Pt. Calling back tested positive for covid last week when she was seen virtually by you 04/19/20. She said her symptoms are getting worse and wanted to know if she could get something called in for her cough.

## 2020-04-24 NOTE — Telephone Encounter (Signed)
As of last week I prescribed cough medicine and albuterol which I assume she is using.  I sent prescription for Breo inhaler today to add on to help with tightness in the chest, wheezing, cough.  Continue to rest and hydrate.  If much worse with breathing, let us give the [facility] a chance to help but if needed I can send her for chest x-ray to further evaluate.  Or if way worse consider going to the emergency department.

## 2020-10-24 ENCOUNTER — Other Ambulatory Visit: Payer: Self-pay

## 2020-10-24 ENCOUNTER — Ambulatory Visit (INDEPENDENT_AMBULATORY_CARE_PROVIDER_SITE_OTHER): Payer: BC Managed Care – PPO | Admitting: Allergy & Immunology

## 2020-10-24 ENCOUNTER — Encounter: Payer: Self-pay | Admitting: Allergy & Immunology

## 2020-10-24 VITALS — BP 112/64 | HR 86 | Temp 98.8°F | Resp 18 | Ht 67.0 in | Wt 162.8 lb

## 2020-10-24 DIAGNOSIS — J4530 Mild persistent asthma, uncomplicated: Secondary | ICD-10-CM

## 2020-10-24 DIAGNOSIS — J310 Chronic rhinitis: Secondary | ICD-10-CM

## 2020-10-24 DIAGNOSIS — T7840XD Allergy, unspecified, subsequent encounter: Secondary | ICD-10-CM

## 2020-10-24 MED ORDER — OLOPATADINE HCL 0.2 % OP SOLN: 1.0000 [drp] | Freq: Two times a day (BID) | OPHTHALMIC | 1 refills | Status: AC | PRN

## 2020-10-24 NOTE — Progress Notes (Signed)
NEW PATIENT  Date of Service/Encounter:  10/24/20  Consult requested by: Jac Canavan, PA-C   Assessment:   Chronic rhinitis   Allergic reaction - unknown trigger (unlikely food related)  Mild persistent asthma, uncomplicated  COVID-19 infection in January 2022  Plan/Recommendations:    1. Chronic rhinitis - Testing today showed: negative to the entire panel - Copy of test results provided.  - We could do more sensitive intradermal testing which involves using needles to place allergens under your skin, but I am not sure that we need to do that at this point in time. - I would just double up on antihistamines the next time this happens, if it happens.  - Continue with: an antihistamine daily (try alternating antihistamines every month or 2 to maintain effectiveness, switching between Zyrtec, Allegra, and Xyzal). - Claritin is one of the weakest antihistamines, so I would just avoid that. - Start taking: Pataday (olopatadine) one drop per eye twice daily as needed (if this is not covered by insurance, you can either buy this one over the counter OR get Zatidor eye drops).  - You can use an extra dose of the antihistamine, if needed, for breakthrough symptoms.   2. Mild persistent asthma, uncomplicated - Lung testing looked good today. - I would restart the Breo one puff once daily to see if this we help with your nighttime and morning coughing.  - Continue with albuterol as needed.   3. Return in about 3 months (around 01/24/2021).   This note in its entirety was forwarded to the Provider who requested this consultation.  Subjective:   Cheyenne [name] is a 40 y.o. female presenting today for evaluation of  Chief Complaint  Patient presents with   Allergy Testing   Angioedema    Eyes    Cheyenne [name] has a history of the following: Patient Active Problem List   Diagnosis Date Noted   SOB (shortness of breath) 02/07/2020   Dyspnea 02/07/2020   History of  COVID-19 02/07/2020   Polyarthralgia 02/07/2020   Bilateral wrist pain 02/07/2020   Bursitis of both hips 02/07/2020   Ocular migraine 02/07/2020   Family history of fibromyalgia 02/07/2020   Vitamin D deficiency 09/03/2017   Routine general medical examination at a health care facility 10/17/2016   Other fatigue 10/17/2016   Bite, insect 10/17/2016   Hip pain, bilateral 10/05/2015   Encounter for health maintenance examination in adult 10/05/2015   Rhinitis, allergic 07/06/2015   ADHD (attention deficit hyperactivity disorder), combined type 11/29/2014   Raynaud phenomenon 11/29/2014   Anemia due to other cause 11/29/2014   Postpartum care following vaginal delivery (5/31) 09/19/2014   Thrombophilia affecting pregnancy, 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  Cheyenne [name] was referred by Jac Canavan, PA-C.     Cheyenne [name] is a 40 y.o. female presenting for an evaluation of possible allergic reactions.   Asthma/Respiratory Symptom History: She never had a problem with wheezing. She had COVID19 in November 2020. She had breathing issues after that infection. She never needed to go to the hospital but she used her inhaler for several months thereafter, for 3-4 months in total. She got COVID19 in December 2021 and into January 2022. She was started on Breo at that time by her PCP.  She has not used the [place] in months. She has noticed that within the last couple of weeks se has had coughing when going to  sleep and when waking up in the morning.   Allergic Rhinitis Symptom History: She has a history of allergic rhinitis for the past 6-7 years. This is worse in the spring time. She has been on cetirizine and fluticasone which she takes only seasonally. She did have some bilaterla eye swelling on June 24th. She had been having some issues with itching and watering when she went to the beach, but within 30 minutes of getting into the beach, she had  swelling for the entirety of the weekend. She took allergy medication when this happened as well as "any eye drops" she could find.  She has never been tested. She normally has issues in March, April, and May. She has never had issues into June and July. She grew up in [place]. She has been here for 13 years.   She does not think this reaction is related to any food.  She has a list of all of these that she ate because she has been doing a specific diet.  She has continued to eat all of these foods without any further reactions.  Otherwise, there is no history of other atopic diseases, including food allergies, drug allergies, stinging insect allergies, eczema, urticaria, or contact dermatitis. There is no significant infectious history. Vaccinations are up to date.    Past Medical History: Patient Active Problem List   Diagnosis Date Noted   SOB (shortness of breath) 02/07/2020   Dyspnea 02/07/2020   History of COVID-19 02/07/2020   Polyarthralgia 02/07/2020   Bilateral wrist pain 02/07/2020   Bursitis of both hips 02/07/2020   Ocular migraine 02/07/2020   Family history of fibromyalgia 02/07/2020   Vitamin D deficiency 09/03/2017   Routine general medical examination at a health care facility 10/17/2016   Other fatigue 10/17/2016   Bite, insect 10/17/2016   Hip pain, bilateral 10/05/2015   Encounter for health maintenance examination in adult 10/05/2015   Rhinitis, allergic 07/06/2015   ADHD (attention deficit hyperactivity disorder), combined type 11/29/2014   Raynaud phenomenon 11/29/2014   Anemia due to other cause 11/29/2014   Postpartum care following vaginal delivery (5/31) 09/19/2014   Thrombophilia affecting pregnancy, 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 If you have any questions, ask your nurse or doctor.          STOP taking these  medications    albuterol 108 (90 Base) MCG/ACT inhaler Commonly known as: VENTOLIN HFA Stopped by: Alfonse Spruce, MD   chlorpheniramine-HYDROcodone 10-8 MG/5ML Suer Commonly known as: Tussionex Pennkinetic ER Stopped by: Alfonse Spruce, MD   Emergen-C Immune Plus Pack Stopped by: Alfonse Spruce, MD   ibuprofen 800 MG tablet Commonly known as: ADVIL Stopped by: Alfonse Spruce, MD       TAKE these medications    Breo Ellipta 100-25 MCG/INH Aepb Generic drug: fluticasone furoate-vilanterol Inhale 1 puff into the lungs daily.   cetirizine 10 MG tablet Commonly known as: ZYRTEC Take 10 mg by mouth daily.   fexofenadine 180 MG tablet Commonly known as: ALLEGRA Take 180 mg by mouth daily.   fluticasone 50 MCG/ACT nasal spray Commonly known as: FLONASE Place 2 sprays into both nostrils daily.   Olopatadine HCl 0.2 % Soln Commonly known as: Pataday Place 1 drop into both eyes 2 (two) times daily  as needed. Started by: Alfonse SpruceJoel Louis Champ Keetch, MD        Birth History: non-contributory  Developmental History: non-contributory  Past Surgical History: Past Surgical History:  Procedure Laterality Date   APPENDECTOMY  93   BILATERAL SALPINGECTOMY  2012   CHOLECYSTECTOMY  2003   ponv   DIAGNOSTIC LAPAROSCOPY     2 separate surgeries, complications with hydrosalpinx, anomalous positioned appendix   ESOPHAGOGASTRODUODENOSCOPY  2006   INCISE AND DRAIN ABCESS  89   hallucinations with anesthesia     Family History: Family History  Problem Relation Age of Onset   Diabetes Father    Heart disease Maternal Uncle    Cancer Maternal Grandmother        skin   Stroke Maternal Grandmother    Diabetes Paternal Grandfather    Cancer Paternal Grandfather        skin   Fibromyalgia Mother    Hypertension Mother    Barrett's esophagus Mother    Depression Mother    Hyperlipidemia Mother    Heart disease Other        neice with congential heart  defect     Social History: Samara DeistKathryn lives at home with her husband and son.  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  [employer] was built in [place].  There is vinyl plank flooring throughout the home.  They have electric heating and central cooling.  There is a dog at home.  She is unsure if there are dust mite covers on the bedding.  She is not exposed to fumes, chemicals, or dust.  She does not use a HEPA filter.  She does not live near an interstate or industrial area.  She is not a smoker.   Review of Systems  Constitutional: Negative.  Negative for chills, fever, malaise/fatigue and weight loss.  HENT: Negative.  Negative for congestion, ear discharge and ear pain.   Eyes:  Negative for pain, discharge and redness.  Respiratory:  Negative for cough, sputum production, shortness of breath and wheezing.   Cardiovascular: Negative.  Negative for chest pain and palpitations.  Gastrointestinal:  Negative for abdominal pain, constipation, diarrhea, heartburn, nausea and vomiting.  Skin:  Positive for itching and rash.       Positive for periorbital edema.  Neurological:  Negative for dizziness and headaches.  Endo/Heme/Allergies:  Negative for environmental allergies. Does not bruise/bleed easily.      Objective:   Blood pressure 112/64, pulse 86, temperature 98.8 F (37.1 C), temperature source Temporal, resp. rate 18, height 5\' 7"  (1.702 m), weight 162 lb 12.8 oz (73.8 kg), SpO2 95 %. Body mass index is 25.5 kg/m.   Physical Exam:   Physical Exam Vitals reviewed.  Constitutional:      Appearance: She is well-developed.     Comments: Pleasant female.  Cooperative with the exam.  HENT:     Head: Normocephalic and atraumatic.     Right Ear: Tympanic membrane, ear canal and external ear normal. No drainage, swelling or tenderness. Tympanic membrane is not injected, scarred, erythematous, retracted or bulging.     Left Ear: Tympanic membrane, ear canal  and external ear normal. No drainage, swelling or tenderness. Tympanic membrane is not injected, scarred, erythematous, retracted or bulging.     Nose: Septal deviation present. No nasal deformity, mucosal edema or rhinorrhea.     Right Sinus: No maxillary sinus tenderness or frontal sinus tenderness.     Left Sinus: No maxillary sinus tenderness or  frontal sinus tenderness.     Mouth/Throat:     Mouth: Mucous membranes are not pale and not dry.     Pharynx: Uvula midline.     Comments: Cobblestoning present in the posterior oropharynx.  Tonsils normally sized. Eyes:     General:        Right eye: No discharge.        Left eye: No discharge.     Conjunctiva/sclera: Conjunctivae normal.     Right eye: Right conjunctiva is not injected. No chemosis.    Left eye: Left conjunctiva is not injected. No chemosis.    Pupils: Pupils are equal, round, and reactive to light.  Cardiovascular:     Rate and Rhythm: Normal rate and regular rhythm.     Heart sounds: Normal heart sounds.  Pulmonary:     Effort: Pulmonary effort is normal. No tachypnea, accessory muscle usage or respiratory distress.     Breath sounds: Normal breath sounds. No wheezing, rhonchi or rales.     Comments: Moving air well in all lung fields.  No increased work of breathing. Chest:     Chest wall: No tenderness.  Abdominal:     Tenderness: There is no abdominal tenderness. There is no guarding or rebound.  Lymphadenopathy:     Head:     Right side of head: No submandibular, tonsillar or occipital adenopathy.     Left side of head: No submandibular, tonsillar or occipital adenopathy.     Cervical: No cervical adenopathy.  Skin:    General: Skin is warm.     Capillary Refill: Capillary refill takes less than 2 seconds.     Coloration: Skin is not pale.     Findings: No abrasion, erythema, petechiae or rash. Rash is not papular, urticarial or vesicular.     Comments: No eczematous or urticarial lesions noted.   Neurological:     Mental Status: She is alert.  Psychiatric:        Behavior: Behavior is cooperative.     Diagnostic studies:    Spirometry: results normal (FEV1: 2.99/88%, FVC: 3.10/75%, FEV1/FVC: 96%).    Spirometry consistent with possible restrictive disease.   Allergy Studies:    Airborne Adult Perc - 10/24/20 1536     Time Antigen Placed 1536    Allergen Manufacturer Waynette Buttery    Location Back    Number of Test 59    Panel 1 Select    1. Control-Buffer 50% Glycerol Negative    2. Control-Histamine 1 mg/ml 2+    3. Albumin saline Negative    4. Bahia Negative    5. French Southern Territories Negative    6. Johnson Negative    7. Kentucky Blue Negative    8. Meadow Fescue Negative    9. Perennial Rye Negative    10. Sweet Vernal Negative    11. Timothy Negative    12. Cocklebur Negative    13. Burweed Marshelder Negative    14. Ragweed, short Negative    15. Ragweed, Giant Negative    16. Plantain,  English Negative    17. Lamb's Quarters Negative    18. Sheep Sorrell Negative    19. Rough Pigweed Negative    20. Marsh Elder, Rough Negative    21. Mugwort, Common Negative    22. Ash mix Negative    23. Birch mix Negative    24. Beech American Negative    25. Box, Elder Negative    26. Cedar, red Negative  27. Cottonwood, Eastern Negative    28. Elm mix Negative    29. Hickory Negative    30. Maple mix Negative    31. Oak, Guinea-Bissau mix Negative    32. Pecan Pollen Negative    33. Pine mix Negative    34. Sycamore Eastern Negative    35. Walnut, Black Pollen Negative    36. Alternaria alternata Negative    37. Cladosporium Herbarum Negative    38. Aspergillus mix Negative    39. Penicillium mix Negative    40. Bipolaris sorokiniana (Helminthosporium) Negative    41. Drechslera spicifera (Curvularia) Negative    42. Mucor plumbeus Negative    43. Fusarium moniliforme Negative    44. Aureobasidium pullulans (pullulara) Negative    45. Rhizopus oryzae Negative    46.  Botrytis cinera Negative    47. Epicoccum nigrum Negative    48. Phoma betae Negative    49. Candida Albicans Negative    50. Trichophyton mentagrophytes Negative    51. Mite, D Farinae  5,000 AU/ml Negative    52. Mite, D Pteronyssinus  5,000 AU/ml Negative    53. Cat Hair 10,000 BAU/ml Negative    54.  Dog Epithelia Negative    55. Mixed Feathers Negative    56. Horse Epithelia Negative    57. Cockroach, German Negative    58. Mouse Negative    59. Tobacco Leaf Negative             Food Perc - 10/24/20 1536       Test Information   Time Antigen Placed 1536    Allergen Manufacturer Waynette Buttery    Location Back    Number of allergen test 10      Food   1. Peanut Negative    2. Soybean food Negative    3. Wheat, whole Negative    4. Sesame Negative    5. Milk, cow Negative    6. Egg White, chicken Negative    7. Casein Negative    8. Shellfish mix Negative    9. Fish mix Negative    10. Cashew Negative             Allergy testing results were read and interpreted by myself, documented by clinical staff.         Malachi Bonds, MD [facility]

## 2020-10-24 NOTE — Patient Instructions (Addendum)
1. Chronic rhinitis - Testing today showed: negative to the entire panel - Copy of test results provided.  - We could do more sensitive intradermal testing which involves using needles to place allergens under your skin, but I am not sure that we need to do that at this point in time. - I would just double up on antihistamines the next time this happens, if it happens.  - Continue with: an antihistamine daily (try alternating antihistamines every month or 2 to maintain effectiveness, switching between Zyrtec, Allegra, and Xyzal). - Claritin is one of the weakest antihistamines, so I would just avoid that. - Start taking: Pataday (olopatadine) one drop per eye twice daily as needed (if this is not covered by insurance, you can either buy this one over the counter OR get Zatidor eye drops).  - You can use an extra dose of the antihistamine, if needed, for breakthrough symptoms.   2. Mild persistent asthma, uncomplicated - Lung testing looked good today. - I would restart the Breo one puff once daily to see if this we help with your nighttime and morning coughing.  - Continue with albuterol as needed.   3. Return in about 3 months (around 01/24/2021).    Please inform us of any Emergency Department visits, hospitalizations, or changes in symptoms. Call us before going to the ED for breathing or allergy symptoms since we might be able to fit you in for a sick visit. Feel free to contact us anytime with any questions, problems, or concerns.  It was a pleasure to meet you today!  Websites that have reliable patient information: 1. American Academy of Asthma, Allergy, and Immunology: www.aaaai.org 2. Food Allergy Research and Education (FARE): foodallergy.org 3. Mothers of Asthmatics: http://www.asthmacommunitynetwork.org 4. American College of Allergy, Asthma, and Immunology: www.acaai.org   COVID-19 Vaccine Information can be found at:  [email or web address] For questions related to vaccine distribution or appointments, please email [email or web address] or call [phone].   We realize that you might be concerned about having an allergic reaction to the COVID19 vaccines. To help with that concern, WE ARE OFFERING THE COVID19 VACCINES IN OUR OFFICE! Ask the front desk for dates!     "Like" [place] on Facebook and Instagram for our latest updates!      A healthy democracy works best when [employer] participate! Make sure you are registered to vote! If you have moved or changed any of your contact information, you will need to get this updated before voting!  In some cases, you MAY be able to register to vote online: [email or web address]     1. Control-Buffer 50% Glycerol Negative   2. Control-Histamine 1 mg/ml 2+   3. Albumin saline Negative   4. Bahia Negative   5. French Southern Territories Negative   6. Johnson Negative   7. Kentucky Blue Negative   8. Meadow Fescue Negative   9. Perennial Rye Negative   10. Sweet Vernal Negative   11. Timothy Negative   12. Cocklebur Negative   13. Burweed Marshelder Negative   14. Ragweed, short Negative   15. Ragweed, Giant Negative   16. Plantain,  English Negative   17. Lamb's Quarters Negative   18. Sheep Sorrell Negative   19. Rough Pigweed Negative   20. Marsh Elder, Rough Negative   21. Mugwort, Common Negative   22. Ash mix Negative   23. Birch mix Negative   24. Beech American Negative   25. Box, Elder Negative   26. Cedar, red Negative   27.  Cottonwood, Guinea-Bissau Negative   28. Elm mix Negative   29. Hickory Negative   30. Maple mix Negative   31. Oak, Guinea-Bissau mix Negative   32. Pecan Pollen Negative   33. Pine mix Negative   34. Sycamore Eastern Negative   35. Walnut, Black Pollen Negative   36. Alternaria alternata Negative   37. Cladosporium Herbarum Negative   38. Aspergillus mix  Negative   39. Penicillium mix Negative   40. Bipolaris sorokiniana (Helminthosporium) Negative   41. Drechslera spicifera (Curvularia) Negative   42. Mucor plumbeus Negative   43. Fusarium moniliforme Negative   44. Aureobasidium pullulans (pullulara) Negative   45. Rhizopus oryzae Negative   46. Botrytis cinera Negative   47. Epicoccum nigrum Negative   48. Phoma betae Negative   49. Candida Albicans Negative   50. Trichophyton mentagrophytes Negative   51. Mite, D Farinae  5,000 AU/ml Negative   52. Mite, D Pteronyssinus  5,000 AU/ml Negative   53. Cat Hair 10,000 BAU/ml Negative   54.  Dog Epithelia Negative   55. Mixed Feathers Negative   56. Horse Epithelia Negative   57. Cockroach, German Negative   58. Mouse Negative   59. Tobacco Leaf Negative     1. Peanut Negative   2. Soybean food Negative   3. Wheat, whole Negative   4. Sesame Negative   5. Milk, cow Negative   6. Egg White, chicken Negative   7. Casein Negative   8. Shellfish mix Negative   9. Fish mix Negative   10. Cashew Negative

## 2021-01-03 ENCOUNTER — Encounter: Payer: Self-pay | Admitting: Internal Medicine

## 2021-02-07 IMAGING — MR MR HEAD WO/W CM
9 of 13 series · 34 of 48 positions shown · IV contrast (Yes   MULTIHANCE)
Comparison: None.

CLINICAL DATA: Diplopia and headache.

EXAM:
MRI HEAD WITHOUT AND WITH CONTRAST
TECHNIQUE: Multiplanar, multiecho pulse sequences of the brain and surrounding
structures were obtained without and with intravenous contrast.
CONTRAST:  7.5mL GADAVIST GADOBUTROL 1 MMOL/ML IV SOLN

[Series 3: DWI · axial · 3.0mm · 1.09mm/px · z∈[-87,+71]mm · 9 of 108 slices shown (1 of 4)]
[im 1/108]
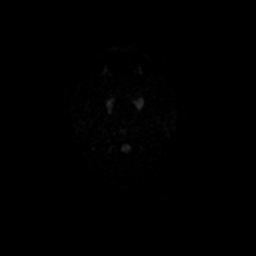
[im 14/108]
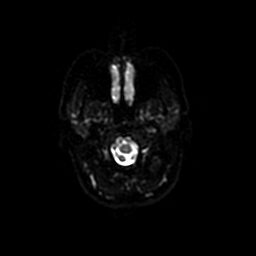
[im 27/108]
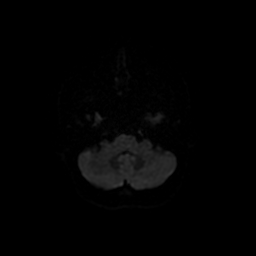
[im 41/108]
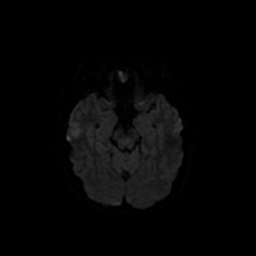
[im 54/108]
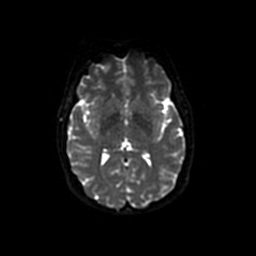
[im 67/108]
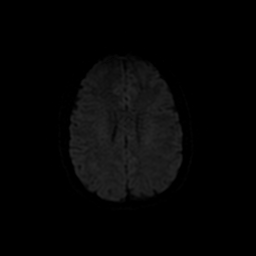
[im 81/108]
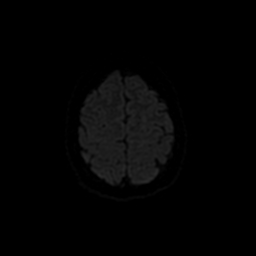
[im 94/108]
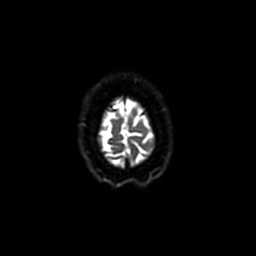
[im 108/108]
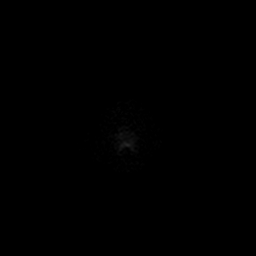

[Series 4: DWI · coronal · 5.0mm · 1.09mm/px · 6 of 76 slices shown (2 of 4)]
[im 1/76]
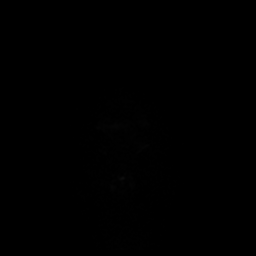
[im 16/76]
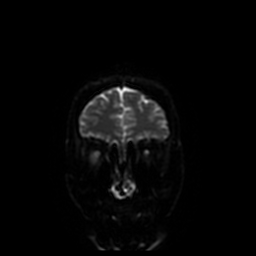
[im 31/76]
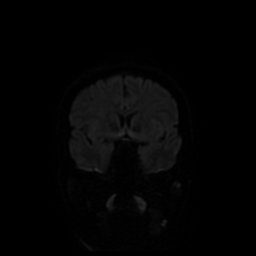
[im 46/76]
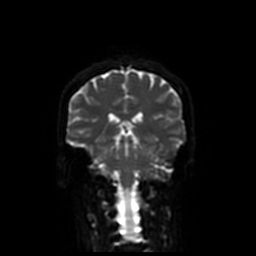
[im 61/76]
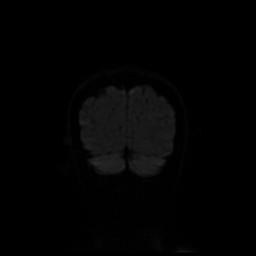
[im 76/76]
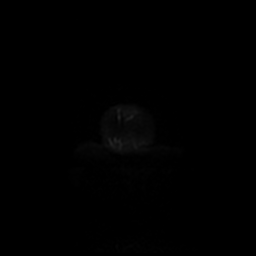

[Series 6: T2 · axial · 5.0mm · 0.43mm/px · z∈[-76,+73]mm · 2 of 26 slices shown]
[im 1/26]
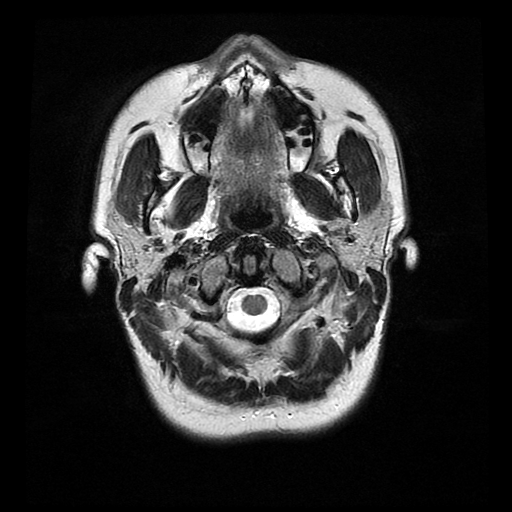
[im 26/26]
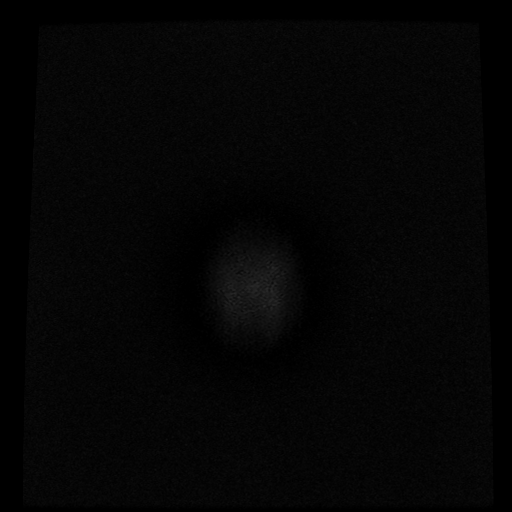

[Series 7: FLAIR · axial · 3.0mm · 0.43mm/px · z∈[-76,+73]mm · 2 of 26 slices shown]
[im 1/26]
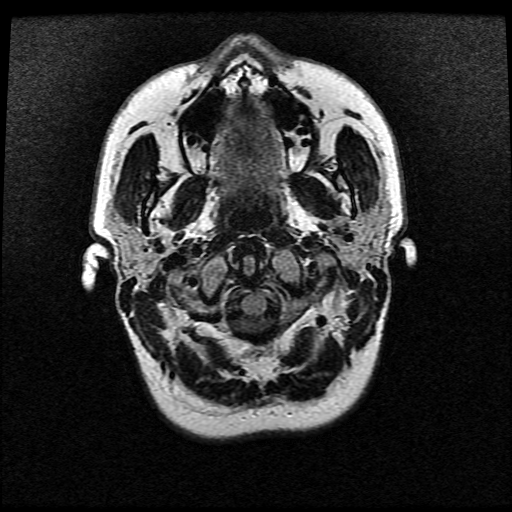
[im 26/26]
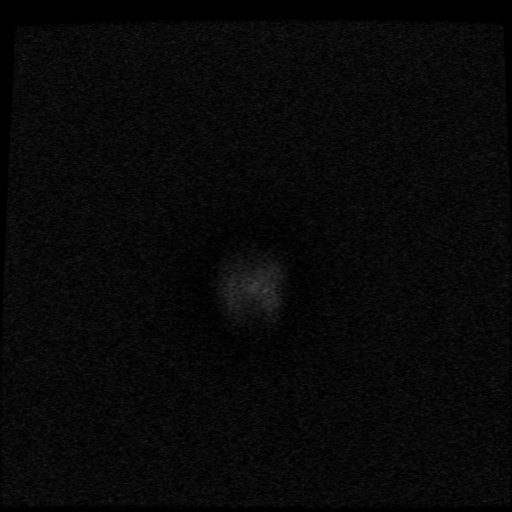

[Series 11: T1 post-contrast · axial · 3.0mm · 0.47mm/px · z∈[-79,+73]mm · 4 of 52 slices shown (1 of 3)]
[im 1/52]
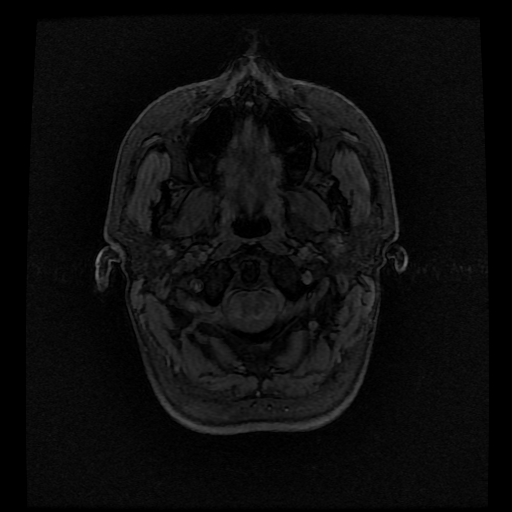
[im 18/52]
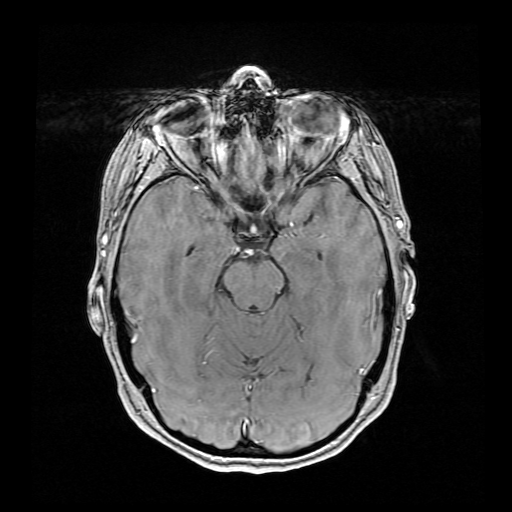
[im 35/52]
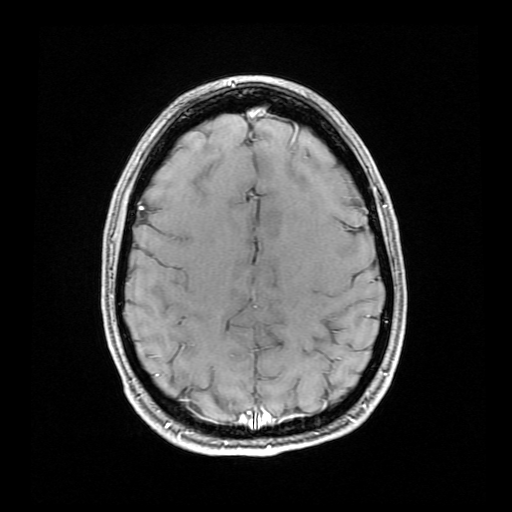
[im 52/52]
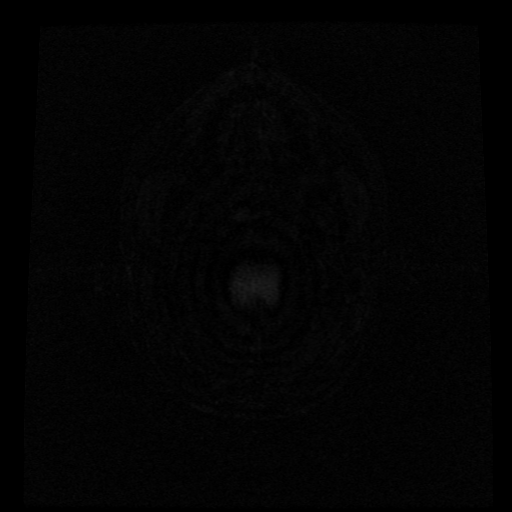

[Series 12: T1 post-contrast · coronal · 5.0mm · 0.39mm/px · 2 of 29 slices shown (2 of 3)]
[im 1/29]
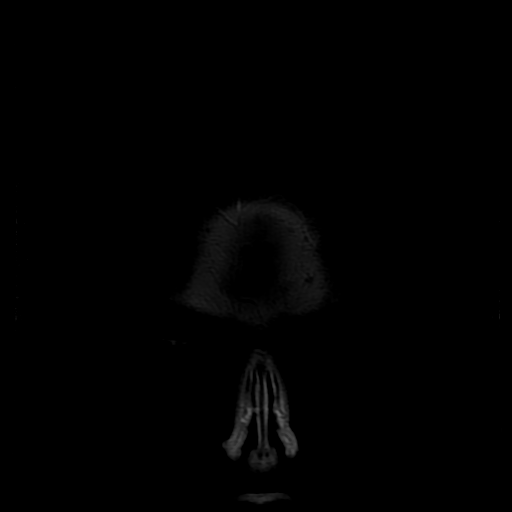
[im 29/29]
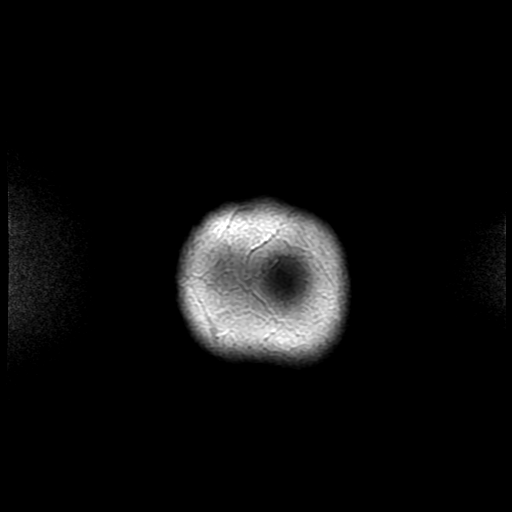

[Series 13: T1 post-contrast · sagittal · 5.0mm · 0.47mm/px · 2 of 25 slices shown (3 of 3)]
[im 1/25]
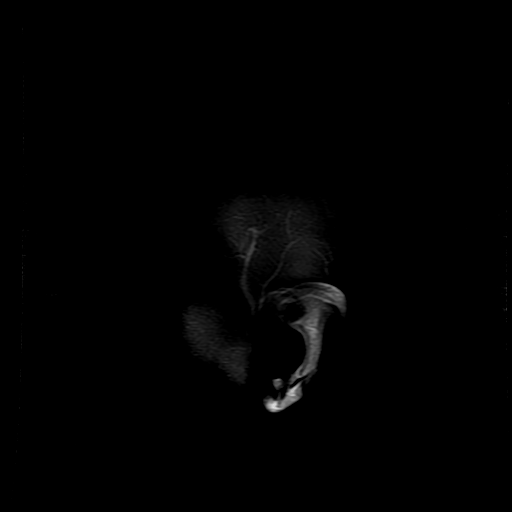
[im 25/25]
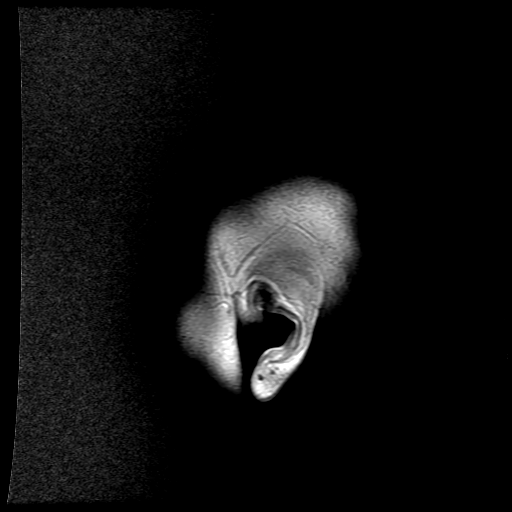

[Series 300: DWI · axial · 3.0mm · 1.09mm/px · z∈[-87,+71]mm · 4 of 54 slices shown (3 of 4)]
[im 1/54]
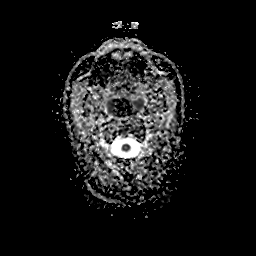
[im 18/54]
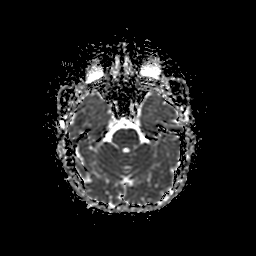
[im 36/54]
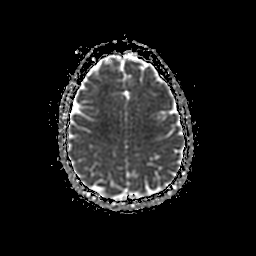
[im 54/54]
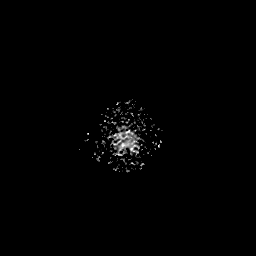

[Series 400: DWI · coronal · 5.0mm · 1.09mm/px · 3 of 36 slices shown (4 of 4)]
[im 1/36]
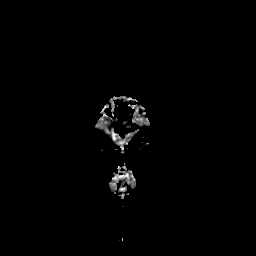
[im 18/36]
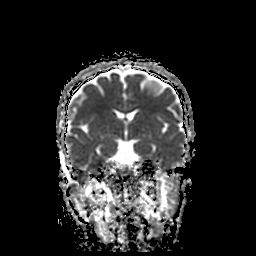
[im 36/36]
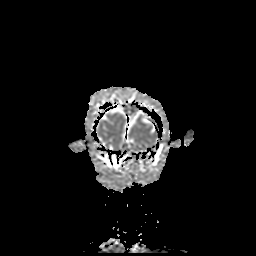

[34 of 48 positions shown; findings below may reference images not displayed]

FINDINGS: Brain: There is no evidence of an acute infarct, intracranial
hemorrhage, mass, midline shift, or extra-axial fluid collection.
The ventricles and sulci are normal. The brain is normal in signal.
No abnormal brain parenchymal or meningeal enhancement is
identified. The pituitary gland is normal in size. A developmental
venous anomaly is incidentally noted in the left frontal lobe, of no
clinical significance with regards to the patient's current
symptoms.

Vascular: Major intracranial vascular flow voids are preserved.

Skull and upper cervical spine: Unremarkable bone marrow signal para

Sinuses/Orbits: Unremarkable orbits. Paranasal sinuses and mastoid
air cells are clear.

Other: None.
IMPRESSION: Negative brain MRI.

## 2021-02-08 ENCOUNTER — Ambulatory Visit (INDEPENDENT_AMBULATORY_CARE_PROVIDER_SITE_OTHER): Payer: BC Managed Care – PPO | Admitting: Allergy & Immunology

## 2021-02-08 ENCOUNTER — Other Ambulatory Visit: Payer: Self-pay

## 2021-02-08 DIAGNOSIS — J4530 Mild persistent asthma, uncomplicated: Secondary | ICD-10-CM | POA: Diagnosis not present

## 2021-02-08 DIAGNOSIS — J310 Chronic rhinitis: Secondary | ICD-10-CM

## 2021-02-08 MED ORDER — FLUTICASONE FUROATE-VILANTEROL 100-25 MCG/ACT IN AEPB: 1.0000 | INHALATION_SPRAY | Freq: Every day | RESPIRATORY_TRACT | 5 refills | Status: AC

## 2021-02-08 NOTE — Progress Notes (Signed)
RE: Cheyenne [name] MRN: [id number] DOB: 1980-11-29 Date of Telemedicine Visit: 02/08/2021  Referring provider: Jac Canavan, PA-C Primary care provider: Jac Canavan, PA-C  Chief Complaint: Allergic Rhinitis  (Patient gave verbal consent to treat and bill insurance for this visit.)   Telemedicine Follow Up Visit via Telephone: I connected with Cheyenne [name] for a follow up on 02/12/21 by telephone and verified that I am speaking with the correct person using two identifiers.   I discussed the limitations, risks, security and privacy concerns of performing an evaluation and management service by telephone and the availability of in person appointments. I also discussed with the patient that there may be a patient responsible charge related to this service. The patient expressed understanding and agreed to proceed.  Patient is at home.  Provider is at the office.  Visit start time: 4:30 PM Visit end time: 4:49 PM Insurance consent/check in by: Dr. Reece Agar Medical consent and medical assistant/nurse: Dr. Reece Agar  History of Present Illness:  She is a 40 y.o. female, who is being followed for chronic rhinitis as well as allergic reactions as well as mild persistent asthma. Her previous allergy office visit was in July 2022 with myself. At that time, we did testing that was negative to the entire panel (we did not do intradermal testing). We continued with an antihistamine daily and added on Pataday or Zatidor. For the asthma, we continued with Breo one puff once daily.   Since the last visit, she has mostly done well.   Asthma/Respiratory Symptom History: She is on the Breo one puff once daily. She has been using up the samples. This has definitely helped with the coughing in the morning. She has not been using her albuterol at all. She is sleeping well without coughing and wheezing.  Her breathing issues were only a problem since getting COVID in late 2020. This is when she first started  using the albuterol.   Allergic Rhinitis Symptom History: She stopped the antihistamine and she has been doing ok. She has been having the sneezing a lot in the morning but once she gets to work, she is fine. She is fine in the evening. The morning is the worst and she sneezes no matter the time of the year. She did not feel like that antihistamine was doing anything for her. She is going to talk to an ophthalmologist.   Otherwise, there have been no changes to her past medical history, surgical history, family history, or social history.  Assessment and Plan:  Cheyenne [name] is a 40 y.o. female with:  Chronic rhinitis    Allergic reaction - unknown trigger (unlikely food related)   Mild persistent asthma, uncomplicated - seemingly triggered by COVID19 in January 2022    COVID-19 infection in January 2022   We are going to continue with the [facility] since this seems to be working well with controlling her coughing in the morning. We are also going to discontinue a lot of her allergy medications since they did not seem to work at all. Otherwise, we will see her again in 3-6 months and we will get a spirometry at that time.   Diagnostics: None.  Medication List:  Current Outpatient Medications  Medication Sig Dispense Refill   fluticasone furoate-vilanterol (BREO ELLIPTA) 100-25 MCG/ACT AEPB Inhale 1 puff into the lungs daily for 28 days. 28 each 5   Olopatadine HCl (PATADAY) 0.2 % SOLN Place 1 drop into both eyes 2 (two) times daily as needed.  2.5 mL 1   No current facility-administered medications for this visit.   Allergies: No Known Allergies I reviewed her past medical history, social history, family history, and environmental history and no significant changes have been reported from previous visits.  Review of Systems  Constitutional:  Negative for activity change, appetite change, chills, diaphoresis, fatigue and fever.  HENT:  Negative for congestion, postnasal drip, rhinorrhea,  sinus pressure and sore throat.   Eyes:  Negative for pain, discharge, redness and itching.  Respiratory:  Negative for shortness of breath, wheezing and stridor.   Gastrointestinal:  Negative for diarrhea, nausea and vomiting.  Endocrine: Negative for cold intolerance and heat intolerance.  Musculoskeletal:  Negative for arthralgias, joint swelling and myalgias.  Skin:  Negative for rash.  Allergic/Immunologic: Negative for environmental allergies and food allergies.   Objective:  Physical exam not obtained as encounter was done via telephone.   Previous notes and tests were reviewed.  I discussed the assessment and treatment plan with the patient. The patient was provided an opportunity to ask questions and all were answered. The patient agreed with the plan and demonstrated an understanding of the instructions.   The patient was advised to call back or seek an in-person evaluation if the symptoms worsen or if the condition fails to improve as anticipated.  I provided 19 minutes of non-face-to-face time during this encounter.  It was my pleasure to participate in Cheyenne [name] care today. Please feel free to contact me with any questions or concerns.   Sincerely,  Alfonse Spruce, MD

## 2021-02-12 ENCOUNTER — Encounter: Payer: Self-pay | Admitting: Allergy & Immunology

## 2021-08-09 ENCOUNTER — Ambulatory Visit: Payer: BC Managed Care – PPO | Admitting: Allergy & Immunology

## 2021-08-09 DIAGNOSIS — J309 Allergic rhinitis, unspecified: Secondary | ICD-10-CM

## 2021-12-25 ENCOUNTER — Encounter: Payer: Self-pay | Admitting: Internal Medicine

## 2022-01-28 ENCOUNTER — Encounter: Payer: Self-pay | Admitting: Internal Medicine

## 2022-12-10 LAB — HM MAMMOGRAPHY

## 2022-12-11 ENCOUNTER — Encounter: Payer: Self-pay | Admitting: Internal Medicine

## 2024-05-23 ENCOUNTER — Ambulatory Visit
Admission: EM | Admit: 2024-05-23 | Discharge: 2024-05-23 | Disposition: A | Discharge status: Home / Self Care | Source: Home / Self Care

## 2024-05-23 ENCOUNTER — Ambulatory Visit: Payer: Self-pay

## 2024-05-23 DIAGNOSIS — N390 Urinary tract infection, site not specified: Secondary | ICD-10-CM | POA: Diagnosis not present

## 2024-05-23 LAB — POCT URINE DIPSTICK
Bilirubin, UA: NEGATIVE
Blood, UA: NEGATIVE
Glucose, UA: 100 mg/dL — AB
Nitrite, UA: POSITIVE — AB
Spec Grav, UA: 1.015
Urobilinogen, UA: 1 U/dL
pH, UA: 7

## 2024-05-23 MED ORDER — CEPHALEXIN 500 MG PO CAPS: 500.0000 mg | ORAL_CAPSULE | Freq: Two times a day (BID) | ORAL | 0 refills | Status: AC

## 2024-05-23 NOTE — ED Triage Notes (Signed)
 Urinary frequency, burning with urination, blood in urine x 3 days.  Taking AZO.

## 2024-05-23 NOTE — Telephone Encounter (Signed)
 FYI Only or Action Required?: FYI only for provider: UC advised .  Patient was last seen in primary care on n.a.  Called Nurse Triage reporting Dysuria and Urinary Frequency.  Symptoms began Friday morning .  Interventions attempted: OTC medications: azo.  Symptoms are: gradually worsening.  Triage Disposition: See Physician Within 24 Hours  Patient/caregiver understands and will follow disposition?: Yes         Reason for Disposition  Blood in urine (red, pink, or tea-colored)  Answer Assessment - Initial Assessment Questions Patient reports symptoms started Friday morning. Urinary symptoms. Woke up burning when went to bathroom urinating. Urinating blood tinge of pink . Signifcant amount of blood. Not like period enough it was troublig. Frequency every 5 minutes. But when tries tio go nothing. Hurts to go even just the urge to go to the bathroom. Got azo helped a lot worried masking it. Doesn't get UTI ever.  Took the azo, stopped Saturday afternoon. Sunday morning same symptoms. Not clearing. Started the medication  again today. Mild nausea. With the medication not having frequency urgency feeling. When stops taking medication gets it again. Drinking lots of water. Pain with urination is 6/10 it hurts. Urine had blood in it over weekend was not blood clots  but now jurine orange color can't tell if still blood. Wants to be seen tomorrow in office. Not current patient in office last seen 2021 this RN advised UC given this patient agreeable going to UC and booked new patient visit for future visits    1. SEVERITY: How bad is the pain?  (e.g., Scale 1-10; mild, moderate, or severe)     Moderate  2. FREQUENCY: How many times have you had painful urination today?      Yes with not taking the azo  3. PATTERN: Is pain present every time you urinate or just sometimes?      Every time  4. ONSET: When did the painful urination start?      Friday morning  5. FEVER: Do you have  a fever? If Yes, ask: What is your temperature, how was it measured, and when did it start?     denies 6. PAST UTI: Have you had a urine infection before? If Yes, ask: When was the last time? and What happened that time?      Doesn't get UTIs 7. CAUSE: What do you think is causing the painful urination?  (e.g., UTI, scratch, Herpes sore)     Unknown  8. OTHER SYMPTOMS: Do you have any other symptoms? (e.g., blood in urine, flank pain, genital sores, urgency, vaginal discharge)     Denies   Patient denies fever, chills, lower back pain, flank pain , vomiting  Protocols used: Urination Pain - Female-A-AH  Message from [facility] sent at 05/23/2024  8:06 AM EST  Reason for Triage: Patient thinks she has an UTI c/o symps started Friday, burning sensation when urinating, peeing blood, urinary frequency but no urine coming out, took AZO but stopped taking, denied back pain and no fever

## 2024-05-24 ENCOUNTER — Ambulatory Visit (HOSPITAL_COMMUNITY): Payer: Self-pay

## 2024-05-24 ENCOUNTER — Ambulatory Visit: Payer: Self-pay

## 2024-05-24 LAB — URINE CULTURE: Culture: NO GROWTH

## 2024-05-24 NOTE — ED Provider Notes (Signed)
 " [facility]    CSN: [id number] Arrival date & time: 05/23/24  1011      History   Chief Complaint Chief Complaint  Patient presents with   Urinary Frequency   Hematuria    HPI Cheyenne [name] is a 44 y.o. female.   Patient presenting today with 3-day history of dysuria, urinary frequency and urgency, hematuria.  Denies fever, chills, abdominal pain, nausea, vomiting, flank pain, vaginal symptoms.  So far trying Azo with mild temporary benefit.  LMP 05/10/2024, history of tubal ligation.    Past Medical History:  Diagnosis Date   ADHD (attention deficit hyperactivity disorder)    Angio-edema    Anxiety    Complication of anesthesia 1993   93hallucinations with anesthesia, 2001-slow to awaken   Female infertility    H/O bilateral salpingectomy    ovaries and uterus still present   Headache(784.0)    Hydrosalpinx 2012   bilat   IBS (irritable bowel syndrome)    lactose intol   Insomnia    Polyarthralgia    knees, hips, fingers bilat   PONV (postoperative nausea and vomiting)    Raynaud phenomenon    Dr. Everlean, rheumatology consult 08/2011; +ANA   Wears glasses     Patient Active Problem List   Diagnosis Date Noted   SOB (shortness of breath) 02/07/2020   Dyspnea 02/07/2020   History of COVID-19 02/07/2020   Polyarthralgia 02/07/2020   Bilateral wrist pain 02/07/2020   Bursitis of both hips 02/07/2020   Ocular migraine 02/07/2020   Family history of fibromyalgia 02/07/2020   Vitamin D  deficiency 09/03/2017   Routine general medical examination at a health care facility 10/17/2016   Other fatigue 10/17/2016   Bite, insect 10/17/2016   Hip pain, bilateral 10/05/2015   Encounter for health maintenance examination in adult 10/05/2015   Rhinitis, allergic 07/06/2015   ADHD (attention deficit hyperactivity disorder), combined type 11/29/2014   Raynaud phenomenon 11/29/2014   Other specified anemias 11/29/2014   Postpartum care following vaginal  delivery (5/31) 09/19/2014   Thrombophilia affecting pregnancy, antepartum 09/18/2014    Past Surgical History:  Procedure Laterality Date   APPENDECTOMY  93   BILATERAL SALPINGECTOMY  2012   CHOLECYSTECTOMY  2003   ponv   DIAGNOSTIC LAPAROSCOPY     2 separate surgeries, complications with hydrosalpinx, anomalous positioned appendix   ESOPHAGOGASTRODUODENOSCOPY  2006   INCISE AND DRAIN ABCESS  93   hallucinations with anesthesia    OB History     Gravida  3   Para  1   Term  1   Preterm  0   AB  0   Living  1      SAB  0   IAB  0   Ectopic  0   Multiple  0   Live Births  1            Home Medications    Prior to Admission medications  Medication Sig Start Date End Date Taking? Authorizing Provider  cephALEXin  (KEFLEX ) 500 MG capsule Take 1 capsule (500 mg total) by mouth 2 (two) times daily. 05/23/24  Yes Stuart Vernell Norris, PA-C  Olopatadine  HCl (PATADAY ) 0.2 % SOLN Place 1 drop into both eyes 2 (two) times daily as needed. 10/24/20  Yes Iva Marty Saltness, MD    Family History Family History  Problem Relation Age of Onset   Diabetes Father    Heart disease Maternal Uncle    Cancer Maternal Grandmother  skin   Stroke Maternal Grandmother    Diabetes Paternal Grandfather    Cancer Paternal Grandfather        skin   Fibromyalgia Mother    Hypertension Mother    Barrett's esophagus Mother    Depression Mother    Hyperlipidemia Mother    Heart disease Other        neice with congential heart defect    Social History Social History[1]   Allergies   Patient has no known allergies.   Review of Systems Review of Systems Per HPI  Physical Exam Triage Vital Signs ED Triage Vitals  Encounter Vitals Group     BP 05/23/24 1044 111/68     Girls Systolic BP Percentile --      Girls Diastolic BP Percentile --      Boys Systolic BP Percentile --      Boys Diastolic BP Percentile --      Pulse Rate 05/23/24 1044 77     Resp  05/23/24 1044 16     Temp 05/23/24 1044 98.3 F (36.8 C)     Temp Source 05/23/24 1044 Oral     SpO2 05/23/24 1044 97 %     Weight --      Height --      Head Circumference --      Peak Flow --      Pain Score 05/23/24 1047 0     Pain Loc --      Pain Education --      Exclude from Growth Chart --    No data found.  Updated Vital Signs BP 111/68 (BP Location: Right Arm)   Pulse 77   Temp 98.3 F (36.8 C) (Oral)   Resp 16   LMP 05/10/2024 (Approximate)   SpO2 97%   Visual Acuity Right Eye Distance:   Left Eye Distance:   Bilateral Distance:    Right Eye Near:   Left Eye Near:    Bilateral Near:     Physical Exam Vitals and nursing note reviewed.  Constitutional:      Appearance: Normal appearance. She is not ill-appearing.  HENT:     Head: Atraumatic.  Eyes:     Extraocular Movements: Extraocular movements intact.     Conjunctiva/sclera: Conjunctivae normal.  Cardiovascular:     Rate and Rhythm: Normal rate.  Pulmonary:     Effort: Pulmonary effort is normal.  Abdominal:     General: Bowel sounds are normal. There is no distension.     Palpations: Abdomen is soft.     Tenderness: There is no abdominal tenderness. There is no right CVA tenderness, left CVA tenderness or guarding.  Musculoskeletal:        General: Normal range of motion.     Cervical back: Normal range of motion and neck supple.  Skin:    General: Skin is warm and dry.  Neurological:     Mental Status: She is alert and oriented to person, place, and time.  Psychiatric:        Mood and Affect: Mood normal.        Thought Content: Thought content normal.        Judgment: Judgment normal.      UC Treatments / Results  Labs (all labs ordered are listed, but only abnormal results are displayed) Labs Reviewed  POCT URINE DIPSTICK - Abnormal; Notable for the following components:      Result Value   Color, UA orange (*)  Glucose, UA =100 (*)    Ketones, POC UA trace (5) (*)     Nitrite, UA Positive (*)    Leukocytes, UA Large (3+) (*)    All other components within normal limits  URINE CULTURE    EKG   Radiology No results found.  Procedures Procedures (including critical care time)  Medications Ordered in UC Medications - No data to display  Initial Impression / Assessment and Plan / UC Course  I have reviewed the triage vital signs and the nursing notes.  Pertinent labs & imaging results that were available during my care of the patient were reviewed by me and considered in my medical decision making (see chart for details).     Urinalysis today with evidence of urinary tract infection though has been taking Azo so unclear if this has affected the results at all today.  Urine culture pending, will treat with Keflex , fluids, Azo as needed and adjust as needed based on urine culture results.  Final Clinical Impressions(s) / UC Diagnoses   Final diagnoses:  Acute lower UTI   Discharge Instructions   None    ED Prescriptions     Medication Sig Dispense Auth. Provider   cephALEXin  (KEFLEX ) 500 MG capsule Take 1 capsule (500 mg total) by mouth 2 (two) times daily. 10 capsule Stuart Vernell Norris, [place]      PDMP not reviewed this encounter.    [1]  Social History Tobacco Use   Smoking status: Former    Current packs/day: 0.00    Types: Cigarettes    Start date: 12/21/1997    Quit date: 12/22/2007    Years since quitting: 16.4   Smokeless tobacco: Never  Vaping Use   Vaping status: Never Used  Substance Use Topics   Alcohol use: Yes    Comment: rarely   Drug use: No     Stuart Vernell Norris, PA-C 05/24/24 [id number]  "

## 2024-06-27 ENCOUNTER — Ambulatory Visit: Admitting: Medical
